# Patient Record
Sex: Male | Born: 1960 | Race: White | Hispanic: No | Marital: Single | Smoking: Never smoker
Health system: Southern US, Community
[De-identification: ages and names within clinical notes are randomized; demographics above are authoritative.]

## PROBLEM LIST (undated history)

## (undated) DIAGNOSIS — N189 Chronic kidney disease, unspecified: Secondary | ICD-10-CM

## (undated) DIAGNOSIS — J349 Unspecified disorder of nose and nasal sinuses: Secondary | ICD-10-CM

## (undated) DIAGNOSIS — H509 Unspecified strabismus: Secondary | ICD-10-CM

## (undated) DIAGNOSIS — Z889 Allergy status to unspecified drugs, medicaments and biological substances status: Secondary | ICD-10-CM

## (undated) DIAGNOSIS — Q211 Atrial septal defect, unspecified: Secondary | ICD-10-CM

## (undated) DIAGNOSIS — Z8774 Personal history of (corrected) congenital malformations of heart and circulatory system: Secondary | ICD-10-CM

## (undated) DIAGNOSIS — Z9889 Other specified postprocedural states: Secondary | ICD-10-CM

## (undated) DIAGNOSIS — K5790 Diverticulosis of intestine, part unspecified, without perforation or abscess without bleeding: Secondary | ICD-10-CM

## (undated) DIAGNOSIS — R0600 Dyspnea, unspecified: Secondary | ICD-10-CM

## (undated) DIAGNOSIS — R011 Cardiac murmur, unspecified: Secondary | ICD-10-CM

## (undated) DIAGNOSIS — I340 Nonrheumatic mitral (valve) insufficiency: Secondary | ICD-10-CM

## (undated) DIAGNOSIS — I059 Rheumatic mitral valve disease, unspecified: Secondary | ICD-10-CM

## (undated) DIAGNOSIS — I5032 Chronic diastolic (congestive) heart failure: Secondary | ICD-10-CM

## (undated) HISTORY — DX: Atrial septal defect, unspecified: Q21.10

## (undated) HISTORY — DX: Dyspnea, unspecified: R06.00

## (undated) HISTORY — PX: NASAL SINUS SURGERY: SHX719

## (undated) HISTORY — PX: EYE SURGERY: SHX253

## (undated) HISTORY — DX: Allergy status to unspecified drugs, medicaments and biological substances: Z88.9

## (undated) HISTORY — DX: Cardiac murmur, unspecified: R01.1

## (undated) HISTORY — DX: Nonrheumatic mitral (valve) insufficiency: I34.0

## (undated) HISTORY — DX: Atrial septal defect: Q21.1

## (undated) HISTORY — DX: Unspecified strabismus: H50.9

## (undated) HISTORY — DX: Unspecified disorder of nose and nasal sinuses: J34.9

## (undated) HISTORY — DX: Rheumatic mitral valve disease, unspecified: I05.9

## (undated) HISTORY — DX: Diverticulosis of intestine, part unspecified, without perforation or abscess without bleeding: K57.90

## (undated) HISTORY — PX: COLONOSCOPY: SHX174

## (undated) HISTORY — DX: Chronic kidney disease, unspecified: N18.9

---

## 2016-04-18 ENCOUNTER — Institutional Professional Consult (permissible substitution) (INDEPENDENT_AMBULATORY_CARE_PROVIDER_SITE_OTHER): Payer: Commercial Managed Care - PPO | Admitting: Thoracic Surgery (Cardiothoracic Vascular Surgery)

## 2016-04-18 ENCOUNTER — Encounter: Payer: Self-pay | Admitting: Thoracic Surgery (Cardiothoracic Vascular Surgery)

## 2016-04-18 DIAGNOSIS — I340 Nonrheumatic mitral (valve) insufficiency: Secondary | ICD-10-CM

## 2016-04-18 DIAGNOSIS — Q211 Atrial septal defect, unspecified: Secondary | ICD-10-CM

## 2016-04-18 HISTORY — DX: Nonrheumatic mitral (valve) insufficiency: I34.0

## 2016-04-18 NOTE — Patient Instructions (Signed)
Continue all previous medications without any changes at this time  Call Darius Bumpyan Brooks in our office to schedule surgery as desired

## 2016-04-18 NOTE — Progress Notes (Signed)
[address]       [place] [address]             [phone]     CARDIOTHORACIC SURGERY CONSULTATION REPORT  Referring Provider is Mayford Knife, Bernestine Amass., MD  Primary Cardiologist is McRae III, A. Maisie Fus, MD PCP is Lucretia Field, MD  Chief Complaint  Patient presents with  . Mitral Regurgitation    Surgical eval, 2ND opionion, Cardiac Cath 04/06/16, TEE 04/06/16 @ [facility] in [place]      HPI:  Patient is a 56 year old male with known history of mitral valve prolapse who has been referred for second opinion for possible surgical treatment of severe symptomatic primary mitral regurgitation. Patient states that he has known about the presence of a heart murmur for nearly 20 years. Over the past year he has noted the development of gradual progression of symptoms of exertional shortness of breath and occasional chest tightness. He was seen in follow-up by his primary care physician and a transthoracic echocardiogram performed demonstrating the presence of mitral valve prolapse with what appear to be a flail segment of posterior leaflet and severe mitral regurgitation.  Left ventricular systolic function remained normal with ejection fraction estimated 65%. There was flow reversal in the pulmonary veins, severe left atrial enlargement, and mildly enlarged left ventricle. The patient subsequently underwent transesophageal echocardiogram and diagnostic cardiac catheterization. TEE confirmed the presence of myxomatous degenerative disease of the mitral valve with a large flail segment involving the middle scallop of the posterior leaflet with multiple ruptured chordae tendineae. There was severe mitral regurgitation. There was flow reversal in the pulmonary veins. The regurgitant volume was calculated 197 mL. There was severe left atrial enlargement. There was a small atrial septal defect versus stretched patent foramen ovale. Left ventricular  systolic function appeared normal with ejection fraction calculated 55-65%. No other significant abnormalities were noted. Diagnostic cardiac catheterization was notable for the presence of normal coronary arteries with no significant coronary artery disease. Right heart catheterization was not performed. The patient was referred for surgical consultation and has seen a Careers adviser in [place]. The patient has requested a second opinion with interested in exploring the possibility of minimally invasive approach for surgery.  The patient is single and lives alone in [place]. He is a Technical sales engineer. He does not exercise on a regular basis but he reports no significant physical limitations other than problems with exertional shortness of breath and fatigue. The patient states that he has had a long history of some degree of exertional shortness of breath. Symptoms have gradually progressed over the past year, particularly over the last 4-6 weeks. The patient now gets short of breath just going up a single flight of steps. He denies any history of resting shortness of breath, PND, orthopnea, or lower extremity edema. He has had some transient dizzy spells without syncope. He reports some mild tightness across his chest that seems to be sporadic and not necessarily associated with physical activity or shortness of breath. He recently has had productive cough for the last several weeks. He thinks he may have had a flulike syndrome several weeks ago. All of his symptoms have resolved with exception of a mild residual cough.  Past Medical History:  Diagnosis Date  . Diverticulosis   . Dyspnea   . H/O seasonal allergies   . Heart murmur   . Mitral valve disease   . Severe mitral regurgitation 04/18/2016  . Sinus disease   .  Strabismus     Past Surgical History:  Procedure Laterality Date  . COLONOSCOPY    . EYE SURGERY     muscle  . NASAL SINUS SURGERY      Family History  Problem Relation  Age of Onset  . Hypertension Mother   . Heart disease Paternal Grandfather     Social History   Social History  . Marital status: Single    Spouse name: N/A  . Number of children: N/A  . Years of education: N/A   Occupational History  . musician    Social History Main Topics  . Smoking status: Never Smoker  . Smokeless tobacco: Never Used  . Alcohol use No  . Drug use: Unknown  . Sexual activity: Not on file   Other Topics Concern  . Not on file   Social History Narrative  . No narrative on file    Current Outpatient Prescriptions  Medication Sig Dispense Refill  . fluticasone (FLONASE) 50 MCG/ACT nasal spray Place into both nostrils daily.    Marland Kitchen loratadine (CLARITIN) 10 MG tablet Take 10 mg by mouth daily.     No current facility-administered medications for this visit.     No Known Allergies    Review of Systems:   General:  normal appetite, decreased energy, no weight gain, no weight loss, no fever  Cardiac:  occasional chest pain with exertion, occasional chest pain at rest, + SOB with exertion, no resting SOB, no PND, no orthopnea, no palpitations, no arrhythmia, no atrial fibrillation, no LE edema, + dizzy spells, no syncope  Respiratory:  + shortness of breath, no home oxygen, + productive cough, no dry cough, no bronchitis, no wheezing, no hemoptysis, no asthma, no pain with inspiration or cough, no sleep apnea, no CPAP at night  GI:   no difficulty swallowing, no reflux, no frequent heartburn, no hiatal hernia, no abdominal pain, no constipation, no diarrhea, no hematochezia, no hematemesis, no melena  GU:   no dysuria,  no frequency, no urinary tract infection, no hematuria, no enlarged prostate, no kidney stones, no kidney disease  Vascular:  no pain suggestive of claudication, no pain in feet, no leg cramps, no varicose veins, no DVT, no non-healing foot ulcer  Neuro:   no stroke, no TIA's, no seizures, no headaches, no temporary blindness one eye,  no  slurred speech, no peripheral neuropathy, no chronic pain, no instability of gait, no memory/cognitive dysfunction  Musculoskeletal: no arthritis, no joint swelling, no myalgias, no difficulty walking, no mobility   Skin:   no rash, no itching, no skin infections, no pressure sores or ulcerations  Psych:   no anxiety, no depression, no nervousness, no unusual recent stress  Eyes:   no blurry vision, no floaters, no recent vision changes, + wears glasses or contacts  ENT:   no hearing loss, no loose or painful teeth, no dentures, last saw dentist Sept 2017  Hematologic:  no easy bruising, no abnormal bleeding, no clotting disorder, no frequent epistaxis  Endocrine:  no diabetes, does not check CBG's at home     Physical Exam:   BP 127/78 (BP Location: Left Arm, Patient Position: Sitting, Cuff Size: Normal)   Pulse 86   Resp 16   Ht 6\' 3"  (1.905 m)   Wt 200 lb (90.7 kg)   SpO2 98% Comment: RA  BMI 25.00 kg/m   General:    well-appearing  HEENT:  Unremarkable   Neck:   no JVD, no bruits, no  adenopathy   Chest:   clear to auscultation, symmetrical breath sounds, no wheezes, no rhonchi   CV:   RRR, grade IV/VI holosystolic murmur   Abdomen:  soft, non-tender, no masses   Extremities:  warm, well-perfused, pulses palpable, no LE edema  Rectal/GU  Deferred  Neuro:   Grossly non-focal and symmetrical throughout  Skin:   Clean and dry, no rashes, no breakdown   Diagnostic Tests:  TRANSESOPHAGEAL ECHOCARDIOGRAM  Both images and the report from transesophageal echocardiogram performed 04/06/2016 by Dr. Theresa MulliganMcRae at [facility] in [place] are reviewed. The patient has obvious myxomatous degenerative disease of the mitral valve with a large flail scallop involving the P2 segment of the posterior leaflet. There are multiple ruptured primary chordae tendineae with severe mitral regurgitation. The jet of regurgitation is directed anteriorly. There was flow reversal  in the pulmonary veins. The regurgitant volume was calculated 197 mL. There was severe left atrial enlargement. There was left to right flow across the fossa ovalis consistent with a large patent foramen ovale or small secundum type atrial septal defect. Left ventricular size and systolic function appeared normal. Right ventricular size and function appeared normal. There was mild tricuspid regurgitation. The tricuspid annulus was not dilated.   CARDIAC CATHETERIZATION  Also images and report from diagnostic cardiac catheterization performed 04/06/2016 by Dr. Keenan BachelorBrian Jefferson at [facility] in [place] are reviewed. The patient has normal coronary artery anatomy with no significant coronary artery disease. There is right dominant coronary circulation.   CT CHEST/ABD/PELVIS without contrast  CT scan of the chest, abdomen, and pelvis performed without intravenous contrast is reviewed. There was mild diffuse opacity in the lungs consistent with mild pulmonary edema. No other significant abnormalities are noted. There is no calcification of appreciated in the wall of the thoracic or abdominal aorta or iliac vessels.    Impression:  Patient has stage D severe symptomatic primary mitral regurgitation caused by mitral valve prolapse with a large flail segment involving the middle scallop of the posterior leaflet of the mitral valve. He presents with progressive symptoms of exertional shortness of breath and fatigue consistent with chronic diastolic congestive heart failure, New York Heart Association functional class II. The patient also experiences occasional episodes of dizziness without syncope and chest tightness both with and without activity.  I have personally reviewed the patient's recent transesophageal echocardiogram and diagnostic cardiac catheterization. 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 The patient also has what appears to be a relatively large patent foramen ovale or small secundum type atrial septal defect.  I agree the patient needs elective mitral valve repair and closure of his atrial septal defect. Risks associated with elective surgery should be extremely low, and based upon review of the patient's transesophageal echocardiogram there is a very high likelihood that his valve should be repairable with an excellent and durable long-term result. He appears to be relatively good candidate for minimally invasive approach for surgery.     Plan:  The patient and his mother were counseled at length regarding the indications, risks and potential benefits of mitral valve repair.  The rationale for elective surgery has been explained, including a comparison between surgery and continued medical therapy with close follow-up.  The likelihood of successful and durable valve repair has been discussed with particular reference to the findings of their recent echocardiogram.  Based upon these  findings and previous experience, I have quoted them a greater than 95 percent likelihood of successful valve repair.   Alternative surgical approaches have been discussed including a comparison between conventional sternotomy and minimally-invasive techniques.  The relative risks and benefits of each have been reviewed as they pertain to the patient's specific circumstances, and all of their questions have been addressed.  Expectations for his postoperative convalescence at been discussed. All of his questions been addressed. The patient is interested in having surgery here in [place] to be close to his sister and mother who lives locally. He will discussed matters further with his family and call us back in the near future to schedule surgery.  I spent in excess of 90 minutes during the conduct of this office consultation and >50% of this time  involved direct face-to-face encounter with the patient for counseling and/or coordination of their care.  Salvatore Decent. Cornelius Moras, MD 04/18/2016 5:12 PM

## 2016-04-19 ENCOUNTER — Encounter: Payer: Self-pay | Admitting: *Deleted

## 2016-05-03 ENCOUNTER — Other Ambulatory Visit: Payer: Self-pay | Admitting: *Deleted

## 2016-05-03 ENCOUNTER — Other Ambulatory Visit: Payer: Self-pay | Admitting: Thoracic Surgery (Cardiothoracic Vascular Surgery)

## 2016-05-03 DIAGNOSIS — Q211 Atrial septal defect, unspecified: Secondary | ICD-10-CM

## 2016-05-03 DIAGNOSIS — I340 Nonrheumatic mitral (valve) insufficiency: Secondary | ICD-10-CM

## 2016-05-03 MED ORDER — AMIODARONE HCL 200 MG PO TABS: 200.0000 mg | ORAL_TABLET | Freq: Every day | ORAL | 0 refills | Status: DC

## 2016-05-03 NOTE — Progress Notes (Signed)
Prescription for amiodarone sent in to begin 7 days prior to surgery to decrease risks of atrial fibrillation.  Patient will be instructed not to take Claritin while he is taking amiodarone.  Purcell Nailslarence H Owen, MD 05/03/2016 4:59 PM

## 2016-05-14 ENCOUNTER — Ambulatory Visit (HOSPITAL_COMMUNITY)
Admission: RE | Admit: 2016-05-14 | Discharge: 2016-05-14 | Disposition: A | Discharge status: Home / Self Care | Payer: Commercial Managed Care - PPO | Source: Ambulatory Visit | Attending: Thoracic Surgery (Cardiothoracic Vascular Surgery) | Admitting: Thoracic Surgery (Cardiothoracic Vascular Surgery)

## 2016-05-14 ENCOUNTER — Other Ambulatory Visit: Payer: Self-pay | Admitting: *Deleted

## 2016-05-14 ENCOUNTER — Other Ambulatory Visit: Payer: Self-pay

## 2016-05-14 ENCOUNTER — Ambulatory Visit (HOSPITAL_BASED_OUTPATIENT_CLINIC_OR_DEPARTMENT_OTHER)
Admission: RE | Admit: 2016-05-14 | Discharge: 2016-05-14 | Disposition: A | Discharge status: Home / Self Care | Payer: Commercial Managed Care - PPO | Source: Ambulatory Visit | Attending: Thoracic Surgery (Cardiothoracic Vascular Surgery) | Admitting: Thoracic Surgery (Cardiothoracic Vascular Surgery)

## 2016-05-14 ENCOUNTER — Encounter (HOSPITAL_COMMUNITY): Payer: Self-pay

## 2016-05-14 ENCOUNTER — Ambulatory Visit (INDEPENDENT_AMBULATORY_CARE_PROVIDER_SITE_OTHER): Payer: Commercial Managed Care - PPO | Admitting: Thoracic Surgery (Cardiothoracic Vascular Surgery)

## 2016-05-14 ENCOUNTER — Encounter: Payer: Self-pay | Admitting: Thoracic Surgery (Cardiothoracic Vascular Surgery)

## 2016-05-14 VITALS — BP 144/76 | HR 91 | Resp 20 | Ht 75.0 in | Wt 199.0 lb

## 2016-05-14 DIAGNOSIS — N189 Chronic kidney disease, unspecified: Secondary | ICD-10-CM

## 2016-05-14 DIAGNOSIS — Q211 Atrial septal defect, unspecified: Secondary | ICD-10-CM

## 2016-05-14 DIAGNOSIS — I340 Nonrheumatic mitral (valve) insufficiency: Secondary | ICD-10-CM

## 2016-05-14 DIAGNOSIS — I059 Rheumatic mitral valve disease, unspecified: Secondary | ICD-10-CM

## 2016-05-14 DIAGNOSIS — Z01818 Encounter for other preprocedural examination: Secondary | ICD-10-CM

## 2016-05-14 DIAGNOSIS — N182 Chronic kidney disease, stage 2 (mild): Secondary | ICD-10-CM

## 2016-05-14 HISTORY — DX: Chronic kidney disease, unspecified: N18.9

## 2016-05-14 LAB — PULMONARY FUNCTION TEST
DL/VA % PRED: 66 %
DL/VA: 3.24 ml/min/mmHg/L
DLCO UNC % PRED: 54 %
DLCO cor % pred: 54 %
DLCO cor: 21.38 ml/min/mmHg
DLCO unc: 21.38 ml/min/mmHg
FEF 25-75 PRE: 3.59 L/s
FEF 25-75 Post: 4.63 L/sec
FEF2575-%CHANGE-POST: 28 %
FEF2575-%PRED-POST: 127 %
FEF2575-%Pred-Pre: 98 %
FEV1-%Change-Post: 5 %
FEV1-%Pred-Post: 89 %
FEV1-%Pred-Pre: 84 %
FEV1-PRE: 3.7 L
FEV1-Post: 3.91 L
FEV1FVC-%CHANGE-POST: 4 %
FEV1FVC-%PRED-PRE: 106 %
FEV6-%Change-Post: 1 %
FEV6-%Pred-Post: 83 %
FEV6-%Pred-Pre: 82 %
FEV6-Post: 4.64 L
FEV6-Pre: 4.59 L
FEV6FVC-%Pred-Post: 104 %
FEV6FVC-%Pred-Pre: 104 %
FVC-%Change-Post: 1 %
FVC-%PRED-PRE: 79 %
FVC-%Pred-Post: 80 %
FVC-PRE: 4.59 L
FVC-Post: 4.64 L
POST FEV1/FVC RATIO: 84 %
PRE FEV6/FVC RATIO: 100 %
Post FEV6/FVC ratio: 100 %
Pre FEV1/FVC ratio: 81 %
RV % pred: 96 %
RV: 2.36 L
TLC % pred: 88 %
TLC: 7.07 L

## 2016-05-14 LAB — COMPREHENSIVE METABOLIC PANEL
ALT: 21 U/L (ref 17–63)
AST: 31 U/L (ref 15–41)
Albumin: 4 g/dL (ref 3.5–5.0)
Alkaline Phosphatase: 46 U/L (ref 38–126)
Anion gap: 7 (ref 5–15)
BILIRUBIN TOTAL: 0.9 mg/dL (ref 0.3–1.2)
BUN: 13 mg/dL (ref 6–20)
CO2: 26 mmol/L (ref 22–32)
CREATININE: 1.52 mg/dL — AB (ref 0.61–1.24)
Calcium: 9.7 mg/dL (ref 8.9–10.3)
Chloride: 107 mmol/L (ref 101–111)
GFR, EST AFRICAN AMERICAN: 57 mL/min — AB (ref >60)
GFR, EST NON AFRICAN AMERICAN: 50 mL/min — AB (ref >60)
Glucose, Bld: 95 mg/dL (ref 65–99)
POTASSIUM: 4 mmol/L (ref 3.5–5.1)
Sodium: 140 mmol/L (ref 135–145)
TOTAL PROTEIN: 7.2 g/dL (ref 6.5–8.1)

## 2016-05-14 LAB — PROTIME-INR
INR: 1.07
PROTHROMBIN TIME: 13.9 s (ref 11.4–15.2)

## 2016-05-14 LAB — BLOOD GAS, ARTERIAL
Acid-base deficit: 0.2 mmol/L (ref 0.0–2.0)
BICARBONATE: 23.4 mmol/L (ref 20.0–28.0)
Drawn by: 470591
FIO2: 0.21
O2 SAT: 97 %
PCO2 ART: 34.7 mmHg (ref 32.0–48.0)
PO2 ART: 84.7 mmHg (ref 83.0–108.0)
Patient temperature: 98.6
pH, Arterial: 7.444 (ref 7.350–7.450)

## 2016-05-14 LAB — ABO/RH: ABO/RH(D): A POS

## 2016-05-14 LAB — URINALYSIS, ROUTINE W REFLEX MICROSCOPIC
Bilirubin Urine: NEGATIVE
GLUCOSE, UA: NEGATIVE mg/dL
Hgb urine dipstick: NEGATIVE
Ketones, ur: NEGATIVE mg/dL
LEUKOCYTES UA: NEGATIVE
NITRITE: NEGATIVE
PH: 7 (ref 5.0–8.0)
Protein, ur: NEGATIVE mg/dL
SPECIFIC GRAVITY, URINE: 1.018 (ref 1.005–1.030)

## 2016-05-14 LAB — SURGICAL PCR SCREEN
MRSA, PCR: NEGATIVE
STAPHYLOCOCCUS AUREUS: POSITIVE — AB

## 2016-05-14 LAB — TYPE AND SCREEN
ABO/RH(D): A POS
Antibody Screen: NEGATIVE

## 2016-05-14 LAB — APTT: APTT: 32 s (ref 24–36)

## 2016-05-14 MED ORDER — ALBUTEROL SULFATE (2.5 MG/3ML) 0.083% IN NEBU
2.5000 mg | INHALATION_SOLUTION | Freq: Once | RESPIRATORY_TRACT | Status: AC
  Administered 2016-05-14: 2.5 mg via RESPIRATORY_TRACT

## 2016-05-14 MED ORDER — CHLORHEXIDINE GLUCONATE 4 % EX LIQD
30.0000 mL | CUTANEOUS | Status: DC
Start: 2016-05-14 — End: 2016-05-15

## 2016-05-14 NOTE — Progress Notes (Addendum)
PCP: Dr. Lucretia FieldKenneth Melton ([place])  Cardiologist: Dr. Landry Mellowhomas McRae (in [place]) [phone]  EKG: 03/2016 in [facility]  Stress test: pt denies  ECHO: 03/2016 in [facility]  Cardiac Cath:03/2016 in [facility]  Chest x-ray:pt denies past 2 weeks  Dr. Cornelius Moraswen has records of all of the above per pt and family

## 2016-05-14 NOTE — Progress Notes (Signed)
[address]       [place] [id number]             [phone]     CARDIOTHORACIC SURGERY OFFICE NOTE  Referring Provider is Turner, Bernestine AmassSamuel J, MD Primary Cardiologist is McRae III, A. Maisie Fushomas, MD PCP is Lucretia FieldVargas, Kenneth, MD  HPI:  Patient returns to the office today for follow-up of mitral valve prolapse with stage D severe symptomatic primary mitral regurgitation. He was originally seen in consultation on 04/18/2016.  Since then he has made plans for his postoperative convalescence.  He states that he has remained stable and he returns to our office with plans to proceed with surgery later this week. He reports no new problems or complaints since his last office visit.   Current Outpatient Prescriptions  Medication Sig Dispense Refill  . amiodarone (PACERONE) 200 MG tablet Take 1 tablet (200 mg total) by mouth daily. 30 tablet 0  . fluticasone (FLONASE) 50 MCG/ACT nasal spray Place 1-2 sprays into both nostrils daily as needed for allergies.     Marland Kitchen. ibuprofen (ADVIL,MOTRIN) 200 MG tablet Take 400 mg by mouth every 8 (eight) hours as needed (for pain.).    Marland Kitchen. Multiple Vitamin (MULTIVITAMIN WITH MINERALS) TABS tablet Take 1 tablet by mouth daily.    . Omega-3 Fatty Acids (FISH OIL PO) Take 2 capsules by mouth daily.    Marland Kitchen. loratadine (CLARITIN) 10 MG tablet Take 10 mg by mouth daily.     No current facility-administered medications for this visit.    Facility-Administered Medications Ordered in Other Visits  Medication Dose Route Frequency Provider Last Rate Last Dose  . chlorhexidine (HIBICLENS) 4 % liquid 2 application  30 mL Topical UD Purcell Nailslarence H Jones Viviani, MD          Physical Exam:   BP (!) 144/76   Pulse 91   Resp 20   Ht 6\' 3"  (1.905 m)   Wt 199 lb (90.3 kg)   SpO2 98% Comment: RA  BMI 24.87 kg/m   General:  Well-appearing  Chest:   Clear to auscultation  CV:   Regular rate and rhythm with prominent holosystolic murmur  Incisions:  n/a  Abdomen:  Soft  and nontender  Extremities:  Warm and well-perfused  Diagnostic Tests:  CHEST  2 VIEW  COMPARISON:  None.  FINDINGS: The heart size and mediastinal contours are within normal limits. Both lungs are clear. The visualized skeletal structures are unremarkable.  IMPRESSION: No active cardiopulmonary disease.   Electronically Signed   By: Alcide CleverMark  Lukens M.D.   On: 05/14/2016 14:46   Impression:  Patient has stage D severe symptomatic primary mitral regurgitation caused by mitral valve prolapse with a large flail segment involving the middle scallop of the posterior leaflet. He presents with progressive symptoms of exertional shortness of breath and fatigue consistent with chronic diastolic congestive heart failure, York Heart Association function class II. The patient also experiences occasional episodes of dizziness without syncope and transient atypical symptoms of chest discomfort that occur both with and without physical activity.  I have personally reviewed the patient's recent transesophageal echocardiogram and diagnostic cardiac catheterization. 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 The patient also has what appears to be a relatively large patent foramen ovale or small secundum type atrial septal defect.  I agree the patient needs elective mitral valve repair and closure  of his atrial septal defect. Risks associated with elective surgery should be extremely low, and based upon review of the patient's transesophageal echocardiogram there is a very high likelihood that his valve should be repairable with an excellent and durable long-term result. He appears to be relatively good candidate for minimally invasive approach for surgery.  Routine preoperative blood work performed earlier today revealed abnormal serum creatinine measured 1.5.   The patient does not recall  ever having been told of any abnormal blood work in the past, including blood work that may have been performed prior to his recent diagnostic cardiac catheterization.   Plan:  The patient was again counseled at length regarding the indications, risks and potential benefits of mitral valve repair.  The rationale for elective surgery has been explained, including a comparison between surgery and continued medical therapy with close follow-up.  The likelihood of successful and durable valve repair has been discussed with particular reference to the findings of their recent echocardiogram.  Based upon these findings and previous experience, I have quoted them a greater than 95 percent likelihood of successful valve repair.  In the unlikely event that their valve cannot be successfully repaired, we discussed the possibility of replacing the mitral valve using a mechanical prosthesis with the attendant need for long-term anticoagulation versus the alternative of replacing it using a bioprosthetic tissue valve with its potential for late structural valve deterioration and failure, depending upon the patient's longevity.  The patient specifically requests that if the mitral valve must be replaced that it be done using a mechanical valve.   The patient understands and accepts all potential risks of surgery including but not limited to risk of death, stroke or other neurologic complication, myocardial infarction, congestive heart failure, respiratory failure, renal failure, bleeding requiring transfusion and/or reexploration, arrhythmia, infection or other wound complications, pneumonia, pleural and/or pericardial effusion, pulmonary embolus, aortic dissection or other major vascular complication, or delayed complications related to valve repair or replacement including but not limited to structural valve deterioration and failure, thrombosis, embolization, endocarditis, or paravalvular leak.  Alternative surgical  approaches have been discussed including a comparison between conventional sternotomy and minimally-invasive techniques.  The relative risks and benefits of each have been reviewed as they pertain to the patient's specific circumstances, and all of their questions have been addressed.  Specific risks potentially related to the minimally-invasive approach were discussed at length, including but not limited to risk of conversion to full or partial sternotomy, aortic dissection or other major vascular complication, unilateral acute lung injury or pulmonary edema, phrenic nerve dysfunction or paralysis, rib fracture, chronic pain, lung hernia, or lymphocele.   Expectations for his convalescence at been discussed. All of their questions have been answered.  The patient will contact his cardiologist and primary care physician's office in [place] to inquire as to whether not he has had basic serum chemistries tested in the past and if so to inquire specifically what the serum creatinine was at that time. We will plan to repeat serum creatinine on arrival prior to surgery but we will not plan to postpone surgery unless it has increased significantly.    I spent in excess of 30 minutes during the conduct of this office consultation and >50% of this time involved direct face-to-face encounter with the patient for counseling and/or coordination of their care.    Salvatore Decent. Cornelius Moras, MD 05/14/2016 2:31 PM

## 2016-05-14 NOTE — Pre-Procedure Instructions (Addendum)
    Trevor JewelJohn E Melton  05/14/2016      [facility] [id number] - [place], [place] - [address] [place] [id number] Phone: [phone] Fax: [phone]    Your procedure is scheduled on Wed., March 7 @ 830 AM.  [facility]mitting at 630 A.M.  Call this number if you have problems the morning of surgery:  [phone]   Remember:  Do not eat food or drink liquids after midnight.  Take these medicines the morning of surgery with A SIP OF WATER amiodarone (pacerone), fluticasone (flonase) nasal spray.  Stop taking any herbal medication, vitamins, ibuprofen, Aleve, Advil, Goody's, BC's now.   Do not wear jewelry.  Do not wear lotions, powders, or colognes, or deoderant.             Men may shave face and neck.  Do not bring valuables to the hospital.  [facility] is not responsible for any belongings or valuables.  Contacts, dentures or bridgework may not be worn into surgery.  Leave your suitcase in the car.  After surgery it may be brought to your room.  For patients admitted to the hospital, discharge time will be determined by your treatment team.  Patients discharged the day of surgery will not be allowed to drive home.    Special instructions: see attached  Please read over the following fact sheets that you were given. Pain Booklet, Coughing and Deep Breathing, MRSA Information and Surgical Site Infection Prevention

## 2016-05-14 NOTE — Patient Instructions (Signed)
   Continue taking all current medications without change through the day before surgery.  Have nothing to eat or drink after midnight the night before surgery.  On the morning of surgery do not take any medications   

## 2016-05-14 NOTE — Progress Notes (Signed)
Pre-op Cardiac Surgery  Carotid Findings:  Study completed at  another facility. No evidence of significant ICA stenosis. Vertebral artery flow is antegrade.  Upper Extremity Right Left  Brachial Pressures 126 Triphasic 127 Triphasic  Radial Waveforms Triphasic Triphasic  Ulnar Waveforms Triphasic Triphasic  Palmar Arch (Allen's Test) Normal Normal   Findings:  Doppler waveforms remained normal bilaterally with both radial and ulnar compressions.  Graybar ElectricVirginia Damaree Sargent, RVS  05/14/2016 11:20 AM

## 2016-05-15 LAB — HEMOGLOBIN A1C
Hgb A1c MFr Bld: 4.8 % (ref 4.8–5.6)
Mean Plasma Glucose: 91 mg/dL

## 2016-05-15 MED ORDER — POTASSIUM CHLORIDE 2 MEQ/ML IV SOLN
80.0000 meq | INTRAVENOUS | Status: DC
  Filled 2016-05-15: qty 40

## 2016-05-15 MED ORDER — TRANEXAMIC ACID (OHS) PUMP PRIME SOLUTION
2.0000 mg/kg | INTRAVENOUS | Status: DC
  Filled 2016-05-15: qty 1.81

## 2016-05-15 MED ORDER — DEXTROSE 5 % IV SOLN
0.0000 ug/min | INTRAVENOUS | Status: DC
  Filled 2016-05-15: qty 4

## 2016-05-15 MED ORDER — TRANEXAMIC ACID (OHS) BOLUS VIA INFUSION
15.0000 mg/kg | INTRAVENOUS | Status: AC
  Administered 2016-05-16: 1354.5 mg via INTRAVENOUS
  Filled 2016-05-15: qty 1355

## 2016-05-15 MED ORDER — METOPROLOL TARTRATE 12.5 MG HALF TABLET
12.5000 mg | ORAL_TABLET | Freq: Once | ORAL | Status: AC
  Administered 2016-05-16: 12.5 mg via ORAL
  Filled 2016-05-15: qty 1

## 2016-05-15 MED ORDER — DOPAMINE-DEXTROSE 3.2-5 MG/ML-% IV SOLN
0.0000 ug/kg/min | INTRAVENOUS | Status: AC
  Administered 2016-05-16: 3 ug/kg/min via INTRAVENOUS
  Filled 2016-05-15: qty 250

## 2016-05-15 MED ORDER — SODIUM CHLORIDE 0.9 % IV SOLN
INTRAVENOUS | Status: DC
  Filled 2016-05-15: qty 2.5

## 2016-05-15 MED ORDER — CHLORHEXIDINE GLUCONATE 0.12 % MT SOLN
15.0000 mL | Freq: Once | OROMUCOSAL | Status: AC
  Administered 2016-05-16: 15 mL via OROMUCOSAL
  Filled 2016-05-15: qty 15

## 2016-05-15 MED ORDER — NITROGLYCERIN IN D5W 200-5 MCG/ML-% IV SOLN
2.0000 ug/min | INTRAVENOUS | Status: DC
  Filled 2016-05-15: qty 250

## 2016-05-15 MED ORDER — SODIUM CHLORIDE 0.9 % IV SOLN
1500.0000 mg | INTRAVENOUS | Status: DC
  Filled 2016-05-15: qty 1500

## 2016-05-15 MED ORDER — TRANEXAMIC ACID 1000 MG/10ML IV SOLN
1.5000 mg/kg/h | INTRAVENOUS | Status: AC
  Administered 2016-05-16: 1.5 mg/kg/h via INTRAVENOUS
  Filled 2016-05-15: qty 25

## 2016-05-15 MED ORDER — GLUTARALDEHYDE 0.625% SOAKING SOLUTION
TOPICAL | Status: DC | PRN
  Filled 2016-05-15: qty 50

## 2016-05-15 MED ORDER — DEXTROSE 5 % IV SOLN
1.5000 g | INTRAVENOUS | Status: DC
  Filled 2016-05-15: qty 1.5

## 2016-05-15 MED ORDER — VANCOMYCIN HCL 1000 MG IV SOLR
INTRAVENOUS | Status: AC
  Administered 2016-05-16: 1000 mL
  Filled 2016-05-15 (×3): qty 1000

## 2016-05-15 MED ORDER — HEPARIN SODIUM (PORCINE) 1000 UNIT/ML IJ SOLN
INTRAMUSCULAR | Status: DC
  Filled 2016-05-15: qty 30

## 2016-05-15 MED ORDER — DEXTROSE 5 % IV SOLN
750.0000 mg | INTRAVENOUS | Status: DC
  Filled 2016-05-15: qty 750

## 2016-05-15 MED ORDER — MAGNESIUM SULFATE 50 % IJ SOLN
40.0000 meq | INTRAMUSCULAR | Status: DC
  Filled 2016-05-15: qty 10

## 2016-05-15 MED ORDER — PLASMA-LYTE 148 IV SOLN
INTRAVENOUS | Status: DC
  Filled 2016-05-15: qty 2.5

## 2016-05-15 MED ORDER — SODIUM CHLORIDE 0.9 % IV SOLN
30.0000 ug/min | INTRAVENOUS | Status: DC
  Filled 2016-05-15: qty 2

## 2016-05-15 MED ORDER — DEXMEDETOMIDINE HCL IN NACL 400 MCG/100ML IV SOLN
0.1000 ug/kg/h | INTRAVENOUS | Status: DC
  Filled 2016-05-15: qty 100

## 2016-05-16 ENCOUNTER — Inpatient Hospital Stay (HOSPITAL_COMMUNITY): Payer: Commercial Managed Care - PPO

## 2016-05-16 ENCOUNTER — Inpatient Hospital Stay (HOSPITAL_COMMUNITY): Payer: Commercial Managed Care - PPO | Admitting: Anesthesiology

## 2016-05-16 ENCOUNTER — Encounter (HOSPITAL_COMMUNITY): Payer: Self-pay | Admitting: Thoracic Surgery (Cardiothoracic Vascular Surgery)

## 2016-05-16 ENCOUNTER — Inpatient Hospital Stay (HOSPITAL_COMMUNITY)
Admission: RE | Admit: 2016-05-16 | Discharge: 2016-05-22 | DRG: 219 | Disposition: A | Discharge status: Home / Self Care | Payer: Commercial Managed Care - PPO | Source: Ambulatory Visit | Attending: Thoracic Surgery (Cardiothoracic Vascular Surgery) | Admitting: Thoracic Surgery (Cardiothoracic Vascular Surgery)

## 2016-05-16 ENCOUNTER — Encounter (HOSPITAL_COMMUNITY)
Admission: RE | Disposition: A | Discharge status: Home / Self Care | Payer: Self-pay | Source: Ambulatory Visit | Attending: Thoracic Surgery (Cardiothoracic Vascular Surgery)

## 2016-05-16 DIAGNOSIS — Q211 Atrial septal defect, unspecified: Secondary | ICD-10-CM

## 2016-05-16 DIAGNOSIS — H509 Unspecified strabismus: Secondary | ICD-10-CM | POA: Diagnosis present

## 2016-05-16 DIAGNOSIS — J9811 Atelectasis: Secondary | ICD-10-CM | POA: Diagnosis not present

## 2016-05-16 DIAGNOSIS — I340 Nonrheumatic mitral (valve) insufficiency: Principal | ICD-10-CM | POA: Diagnosis present

## 2016-05-16 DIAGNOSIS — I272 Pulmonary hypertension, unspecified: Secondary | ICD-10-CM | POA: Diagnosis not present

## 2016-05-16 DIAGNOSIS — R0602 Shortness of breath: Secondary | ICD-10-CM | POA: Diagnosis present

## 2016-05-16 DIAGNOSIS — I341 Nonrheumatic mitral (valve) prolapse: Secondary | ICD-10-CM | POA: Diagnosis present

## 2016-05-16 DIAGNOSIS — D6959 Other secondary thrombocytopenia: Secondary | ICD-10-CM | POA: Diagnosis not present

## 2016-05-16 DIAGNOSIS — J939 Pneumothorax, unspecified: Secondary | ICD-10-CM | POA: Diagnosis not present

## 2016-05-16 DIAGNOSIS — R410 Disorientation, unspecified: Secondary | ICD-10-CM | POA: Diagnosis not present

## 2016-05-16 DIAGNOSIS — Z9889 Other specified postprocedural states: Secondary | ICD-10-CM

## 2016-05-16 DIAGNOSIS — D62 Acute posthemorrhagic anemia: Secondary | ICD-10-CM | POA: Diagnosis not present

## 2016-05-16 DIAGNOSIS — I441 Atrioventricular block, second degree: Secondary | ICD-10-CM | POA: Diagnosis not present

## 2016-05-16 DIAGNOSIS — Z79899 Other long term (current) drug therapy: Secondary | ICD-10-CM | POA: Diagnosis not present

## 2016-05-16 DIAGNOSIS — I5033 Acute on chronic diastolic (congestive) heart failure: Secondary | ICD-10-CM | POA: Diagnosis not present

## 2016-05-16 DIAGNOSIS — Z8774 Personal history of (corrected) congenital malformations of heart and circulatory system: Secondary | ICD-10-CM

## 2016-05-16 DIAGNOSIS — N189 Chronic kidney disease, unspecified: Secondary | ICD-10-CM | POA: Diagnosis present

## 2016-05-16 DIAGNOSIS — I5032 Chronic diastolic (congestive) heart failure: Secondary | ICD-10-CM | POA: Diagnosis present

## 2016-05-16 DIAGNOSIS — E876 Hypokalemia: Secondary | ICD-10-CM | POA: Diagnosis not present

## 2016-05-16 DIAGNOSIS — J90 Pleural effusion, not elsewhere classified: Secondary | ICD-10-CM

## 2016-05-16 DIAGNOSIS — I511 Rupture of chordae tendineae, not elsewhere classified: Secondary | ICD-10-CM | POA: Diagnosis not present

## 2016-05-16 HISTORY — PX: MITRAL VALVE REPAIR: SHX2039

## 2016-05-16 HISTORY — PX: ASD REPAIR: SHX258

## 2016-05-16 HISTORY — PX: CLIPPING OF ATRIAL APPENDAGE: SHX5773

## 2016-05-16 HISTORY — DX: Other specified postprocedural states: Z98.890

## 2016-05-16 HISTORY — DX: Chronic diastolic (congestive) heart failure: I50.32

## 2016-05-16 HISTORY — PX: TEE WITHOUT CARDIOVERSION: SHX5443

## 2016-05-16 HISTORY — DX: Personal history of (corrected) congenital malformations of heart and circulatory system: Z87.74

## 2016-05-16 LAB — CBC
HCT: 38.2 % — ABNORMAL LOW (ref 39.0–52.0)
HEMATOCRIT: 36.9 % — AB (ref 39.0–52.0)
HEMOGLOBIN: 13.2 g/dL (ref 13.0–17.0)
Hemoglobin: 12.6 g/dL — ABNORMAL LOW (ref 13.0–17.0)
MCH: 30.1 pg (ref 26.0–34.0)
MCH: 30.8 pg (ref 26.0–34.0)
MCHC: 34.1 g/dL (ref 30.0–36.0)
MCHC: 34.6 g/dL (ref 30.0–36.0)
MCV: 88.1 fL (ref 78.0–100.0)
MCV: 89 fL (ref 78.0–100.0)
PLATELETS: 173 10*3/uL (ref 150–400)
PLATELETS: 132 10*3/uL — AB (ref 150–400)
RBC: 4.19 MIL/uL — ABNORMAL LOW (ref 4.22–5.81)
RBC: 4.29 MIL/uL (ref 4.22–5.81)
RDW: 13.4 % (ref 11.5–15.5)
RDW: 13.7 % (ref 11.5–15.5)
WBC: 5.4 10*3/uL (ref 4.0–10.5)
WBC: 18.5 10*3/uL — ABNORMAL HIGH (ref 4.0–10.5)

## 2016-05-16 LAB — POCT I-STAT, CHEM 8
BUN: 14 mg/dL (ref 6–20)
BUN: 13 mg/dL (ref 6–20)
BUN: 14 mg/dL (ref 6–20)
BUN: 12 mg/dL (ref 6–20)
BUN: 12 mg/dL (ref 6–20)
BUN: 12 mg/dL (ref 6–20)
BUN: 12 mg/dL (ref 6–20)
BUN: 11 mg/dL (ref 6–20)
CALCIUM ION: 1.3 mmol/L (ref 1.15–1.40)
CALCIUM ION: 1.19 mmol/L (ref 1.15–1.40)
CALCIUM ION: 1.21 mmol/L (ref 1.15–1.40)
CHLORIDE: 103 mmol/L (ref 101–111)
CHLORIDE: 107 mmol/L (ref 101–111)
CHLORIDE: 109 mmol/L (ref 101–111)
Calcium, Ion: 1.11 mmol/L — ABNORMAL LOW (ref 1.15–1.40)
Calcium, Ion: 1.13 mmol/L — ABNORMAL LOW (ref 1.15–1.40)
Calcium, Ion: 1.12 mmol/L — ABNORMAL LOW (ref 1.15–1.40)
Calcium, Ion: 1.13 mmol/L — ABNORMAL LOW (ref 1.15–1.40)
Calcium, Ion: 1.14 mmol/L — ABNORMAL LOW (ref 1.15–1.40)
Chloride: 106 mmol/L (ref 101–111)
Chloride: 106 mmol/L (ref 101–111)
Chloride: 101 mmol/L (ref 101–111)
Chloride: 106 mmol/L (ref 101–111)
Chloride: 108 mmol/L (ref 101–111)
Creatinine, Ser: 1.3 mg/dL — ABNORMAL HIGH (ref 0.61–1.24)
Creatinine, Ser: 1.1 mg/dL (ref 0.61–1.24)
Creatinine, Ser: 1.2 mg/dL (ref 0.61–1.24)
Creatinine, Ser: 1.1 mg/dL (ref 0.61–1.24)
Creatinine, Ser: 1.1 mg/dL (ref 0.61–1.24)
Creatinine, Ser: 1 mg/dL (ref 0.61–1.24)
Creatinine, Ser: 1.1 mg/dL (ref 0.61–1.24)
Creatinine, Ser: 1.1 mg/dL (ref 0.61–1.24)
GLUCOSE: 128 mg/dL — AB (ref 65–99)
Glucose, Bld: 96 mg/dL (ref 65–99)
Glucose, Bld: 111 mg/dL — ABNORMAL HIGH (ref 65–99)
Glucose, Bld: 121 mg/dL — ABNORMAL HIGH (ref 65–99)
Glucose, Bld: 134 mg/dL — ABNORMAL HIGH (ref 65–99)
Glucose, Bld: 137 mg/dL — ABNORMAL HIGH (ref 65–99)
Glucose, Bld: 130 mg/dL — ABNORMAL HIGH (ref 65–99)
Glucose, Bld: 122 mg/dL — ABNORMAL HIGH (ref 65–99)
HCT: 34 % — ABNORMAL LOW (ref 39.0–52.0)
HCT: 32 % — ABNORMAL LOW (ref 39.0–52.0)
HCT: 34 % — ABNORMAL LOW (ref 39.0–52.0)
HCT: 34 % — ABNORMAL LOW (ref 39.0–52.0)
HEMATOCRIT: 25 % — AB (ref 39.0–52.0)
HEMATOCRIT: 25 % — AB (ref 39.0–52.0)
HEMATOCRIT: 25 % — AB (ref 39.0–52.0)
HEMATOCRIT: 26 % — AB (ref 39.0–52.0)
HEMOGLOBIN: 11.6 g/dL — AB (ref 13.0–17.0)
HEMOGLOBIN: 10.9 g/dL — AB (ref 13.0–17.0)
HEMOGLOBIN: 8.5 g/dL — AB (ref 13.0–17.0)
HEMOGLOBIN: 8.5 g/dL — AB (ref 13.0–17.0)
HEMOGLOBIN: 8.5 g/dL — AB (ref 13.0–17.0)
HEMOGLOBIN: 11.6 g/dL — AB (ref 13.0–17.0)
Hemoglobin: 11.6 g/dL — ABNORMAL LOW (ref 13.0–17.0)
Hemoglobin: 8.8 g/dL — ABNORMAL LOW (ref 13.0–17.0)
POTASSIUM: 4.9 mmol/L (ref 3.5–5.1)
POTASSIUM: 5.2 mmol/L — AB (ref 3.5–5.1)
POTASSIUM: 4.2 mmol/L (ref 3.5–5.1)
Potassium: 3.9 mmol/L (ref 3.5–5.1)
Potassium: 4.6 mmol/L (ref 3.5–5.1)
Potassium: 4.6 mmol/L (ref 3.5–5.1)
Potassium: 5 mmol/L (ref 3.5–5.1)
Potassium: 4.6 mmol/L (ref 3.5–5.1)
SODIUM: 141 mmol/L (ref 135–145)
SODIUM: 141 mmol/L (ref 135–145)
SODIUM: 133 mmol/L — AB (ref 135–145)
SODIUM: 139 mmol/L (ref 135–145)
SODIUM: 139 mmol/L (ref 135–145)
SODIUM: 141 mmol/L (ref 135–145)
Sodium: 141 mmol/L (ref 135–145)
Sodium: 141 mmol/L (ref 135–145)
TCO2: 28 mmol/L (ref 0–100)
TCO2: 25 mmol/L (ref 0–100)
TCO2: 26 mmol/L (ref 0–100)
TCO2: 25 mmol/L (ref 0–100)
TCO2: 26 mmol/L (ref 0–100)
TCO2: 25 mmol/L (ref 0–100)
TCO2: 22 mmol/L (ref 0–100)
TCO2: 22 mmol/L (ref 0–100)

## 2016-05-16 LAB — POCT I-STAT 3, ART BLOOD GAS (G3+)
ACID-BASE DEFICIT: 6 mmol/L — AB (ref 0.0–2.0)
Acid-base deficit: 1 mmol/L (ref 0.0–2.0)
Acid-base deficit: 5 mmol/L — ABNORMAL HIGH (ref 0.0–2.0)
BICARBONATE: 19.9 mmol/L — AB (ref 20.0–28.0)
Bicarbonate: 24.9 mmol/L (ref 20.0–28.0)
Bicarbonate: 20.5 mmol/L (ref 20.0–28.0)
O2 SAT: 100 %
O2 SAT: 93 %
O2 Saturation: 95 %
PCO2 ART: 43.8 mmHg (ref 32.0–48.0)
PCO2 ART: 37.1 mmHg (ref 32.0–48.0)
PH ART: 7.363 (ref 7.350–7.450)
PH ART: 7.344 — AB (ref 7.350–7.450)
PH ART: 7.342 — AB (ref 7.350–7.450)
PO2 ART: 67 mmHg — AB (ref 83.0–108.0)
Patient temperature: 36
TCO2: 26 mmol/L (ref 0–100)
TCO2: 22 mmol/L (ref 0–100)
TCO2: 21 mmol/L (ref 0–100)
pCO2 arterial: 36.3 mmHg (ref 32.0–48.0)
pO2, Arterial: 308 mmHg — ABNORMAL HIGH (ref 83.0–108.0)
pO2, Arterial: 76 mmHg — ABNORMAL LOW (ref 83.0–108.0)

## 2016-05-16 LAB — BASIC METABOLIC PANEL
Anion gap: 7 (ref 5–15)
BUN: 14 mg/dL (ref 6–20)
CO2: 24 mmol/L (ref 22–32)
CREATININE: 1.45 mg/dL — AB (ref 0.61–1.24)
Calcium: 9.5 mg/dL (ref 8.9–10.3)
Chloride: 107 mmol/L (ref 101–111)
GFR calc Af Amer: >60 mL/min (ref >60)
GFR, EST NON AFRICAN AMERICAN: 52 mL/min — AB (ref >60)
Glucose, Bld: 94 mg/dL (ref 65–99)
POTASSIUM: 3.7 mmol/L (ref 3.5–5.1)
SODIUM: 138 mmol/L (ref 135–145)

## 2016-05-16 LAB — GLUCOSE, CAPILLARY
GLUCOSE-CAPILLARY: 125 mg/dL — AB (ref 65–99)
GLUCOSE-CAPILLARY: 126 mg/dL — AB (ref 65–99)
GLUCOSE-CAPILLARY: 132 mg/dL — AB (ref 65–99)
GLUCOSE-CAPILLARY: 144 mg/dL — AB (ref 65–99)
Glucose-Capillary: 127 mg/dL — ABNORMAL HIGH (ref 65–99)
Glucose-Capillary: 121 mg/dL — ABNORMAL HIGH (ref 65–99)
Glucose-Capillary: 128 mg/dL — ABNORMAL HIGH (ref 65–99)

## 2016-05-16 LAB — POCT I-STAT 4, (NA,K, GLUC, HGB,HCT)
Glucose, Bld: 146 mg/dL — ABNORMAL HIGH (ref 65–99)
HCT: 38 % — ABNORMAL LOW (ref 39.0–52.0)
HEMOGLOBIN: 12.9 g/dL — AB (ref 13.0–17.0)
POTASSIUM: 4.7 mmol/L (ref 3.5–5.1)
SODIUM: 141 mmol/L (ref 135–145)

## 2016-05-16 LAB — ECHO TEE
AOASC: 2.9 cm
Ao-desc: 2 cm
STJ: 3 cm
Sinus: 3.5 cm

## 2016-05-16 LAB — PROTIME-INR
INR: 1.42
PROTHROMBIN TIME: 17.5 s — AB (ref 11.4–15.2)

## 2016-05-16 LAB — CREATININE, SERUM: CREATININE: 1.16 mg/dL (ref 0.61–1.24)

## 2016-05-16 LAB — APTT: aPTT: 36 seconds (ref 24–36)

## 2016-05-16 LAB — PLATELET COUNT: PLATELETS: 105 10*3/uL — AB (ref 150–400)

## 2016-05-16 LAB — MAGNESIUM: MAGNESIUM: 3.1 mg/dL — AB (ref 1.7–2.4)

## 2016-05-16 LAB — HEMOGLOBIN AND HEMATOCRIT, BLOOD
HEMATOCRIT: 24.5 % — AB (ref 39.0–52.0)
Hemoglobin: 8.5 g/dL — ABNORMAL LOW (ref 13.0–17.0)

## 2016-05-16 SURGERY — REPAIR, MITRAL VALVE, MINIMALLY INVASIVE
Anesthesia: General | Site: Chest | Laterality: Right

## 2016-05-16 MED ORDER — BUPIVACAINE HCL (PF) 0.5 % IJ SOLN
INTRAMUSCULAR | Status: AC
  Filled 2016-05-16: qty 10

## 2016-05-16 MED ORDER — PROTAMINE SULFATE 10 MG/ML IV SOLN
INTRAVENOUS | Status: AC
  Filled 2016-05-16: qty 25

## 2016-05-16 MED ORDER — FENTANYL CITRATE (PF) 250 MCG/5ML IJ SOLN
INTRAMUSCULAR | Status: AC
  Filled 2016-05-16: qty 20

## 2016-05-16 MED ORDER — ALBUMIN HUMAN 5 % IV SOLN
250.0000 mL | INTRAVENOUS | Status: AC | PRN
  Administered 2016-05-16 – 2016-05-17 (×4): 250 mL via INTRAVENOUS
  Filled 2016-05-16: qty 250

## 2016-05-16 MED ORDER — PROTAMINE SULFATE 10 MG/ML IV SOLN
INTRAVENOUS | Status: AC
  Filled 2016-05-16: qty 5

## 2016-05-16 MED ORDER — MILRINONE LACTATE IN DEXTROSE 20-5 MG/100ML-% IV SOLN
0.1250 ug/kg/min | INTRAVENOUS | Status: DC
  Filled 2016-05-16 (×2): qty 100

## 2016-05-16 MED ORDER — LACTATED RINGERS IV SOLN
INTRAVENOUS | Status: DC | PRN
  Administered 2016-05-16: 10:00:00 via INTRAVENOUS

## 2016-05-16 MED ORDER — SODIUM CHLORIDE 0.9% FLUSH
3.0000 mL | Freq: Two times a day (BID) | INTRAVENOUS | Status: DC
  Administered 2016-05-17 – 2016-05-21 (×5): 3 mL via INTRAVENOUS

## 2016-05-16 MED ORDER — LACTATED RINGERS IV SOLN: 500.0000 mL | Freq: Once | INTRAVENOUS | Status: DC | PRN

## 2016-05-16 MED ORDER — HEPARIN SODIUM (PORCINE) 1000 UNIT/ML IJ SOLN
INTRAMUSCULAR | Status: AC
  Filled 2016-05-16: qty 1

## 2016-05-16 MED ORDER — ASPIRIN 81 MG PO CHEW: 324.0000 mg | CHEWABLE_TABLET | Freq: Every day | ORAL | Status: DC

## 2016-05-16 MED ORDER — MORPHINE SULFATE (PF) 2 MG/ML IV SOLN
1.0000 mg | INTRAVENOUS | Status: DC | PRN
  Administered 2016-05-16 – 2016-05-17 (×3): 2 mg via INTRAVENOUS
  Filled 2016-05-16 (×3): qty 1

## 2016-05-16 MED ORDER — BISACODYL 10 MG RE SUPP: 10.0000 mg | Freq: Every day | RECTAL | Status: DC

## 2016-05-16 MED ORDER — ACETAMINOPHEN 160 MG/5ML PO SOLN
1000.0000 mg | Freq: Four times a day (QID) | ORAL | Status: DC
  Administered 2016-05-16: 1000 mg
  Filled 2016-05-16: qty 40.6

## 2016-05-16 MED ORDER — LIDOCAINE HCL (CARDIAC) 20 MG/ML IV SOLN
INTRAVENOUS | Status: DC | PRN
  Administered 2016-05-16: 40 mg via INTRAVENOUS

## 2016-05-16 MED ORDER — PHENYLEPHRINE 40 MCG/ML (10ML) SYRINGE FOR IV PUSH (FOR BLOOD PRESSURE SUPPORT)
PREFILLED_SYRINGE | INTRAVENOUS | Status: AC
  Filled 2016-05-16: qty 10

## 2016-05-16 MED ORDER — DEXTROSE 5 % IV SOLN
1.5000 g | Freq: Two times a day (BID) | INTRAVENOUS | Status: AC
  Administered 2016-05-17 – 2016-05-18 (×4): 1.5 g via INTRAVENOUS
  Filled 2016-05-16 (×4): qty 1.5

## 2016-05-16 MED ORDER — ACETAMINOPHEN 500 MG PO TABS
1000.0000 mg | ORAL_TABLET | Freq: Four times a day (QID) | ORAL | Status: DC
  Administered 2016-05-17 – 2016-05-20 (×12): 1000 mg via ORAL
  Filled 2016-05-16 (×12): qty 2

## 2016-05-16 MED ORDER — METOPROLOL TARTRATE 5 MG/5ML IV SOLN: 2.5000 mg | INTRAVENOUS | Status: DC | PRN

## 2016-05-16 MED ORDER — INSULIN REGULAR BOLUS VIA INFUSION
0.0000 [IU] | Freq: Three times a day (TID) | INTRAVENOUS | Status: DC
  Filled 2016-05-16: qty 10

## 2016-05-16 MED ORDER — LACTATED RINGERS IV SOLN: INTRAVENOUS | Status: DC

## 2016-05-16 MED ORDER — LACTATED RINGERS IV SOLN
INTRAVENOUS | Status: DC
  Administered 2016-05-16: 17:00:00 via INTRAVENOUS

## 2016-05-16 MED ORDER — PHENYLEPHRINE HCL 10 MG/ML IJ SOLN
INTRAMUSCULAR | Status: DC | PRN
  Administered 2016-05-16: 80 ug via INTRAVENOUS

## 2016-05-16 MED ORDER — LIDOCAINE HCL (PF) 2 % IJ SOLN
13.0000 mL | Freq: Once | INTRAMUSCULAR | Status: DC
  Filled 2016-05-16: qty 15

## 2016-05-16 MED ORDER — LIDOCAINE 2% (20 MG/ML) 5 ML SYRINGE
INTRAMUSCULAR | Status: AC
  Filled 2016-05-16: qty 5

## 2016-05-16 MED ORDER — ARTIFICIAL TEARS OP OINT
TOPICAL_OINTMENT | OPHTHALMIC | Status: DC | PRN
  Administered 2016-05-16: 1 via OPHTHALMIC

## 2016-05-16 MED ORDER — VANCOMYCIN HCL 1000 MG IV SOLR
INTRAVENOUS | Status: DC | PRN
  Administered 2016-05-16: 1000 mg via INTRAVENOUS

## 2016-05-16 MED ORDER — MIDAZOLAM HCL 10 MG/2ML IJ SOLN
INTRAMUSCULAR | Status: AC
  Filled 2016-05-16: qty 2

## 2016-05-16 MED ORDER — SODIUM CHLORIDE 0.45 % IV SOLN
INTRAVENOUS | Status: DC | PRN
  Administered 2016-05-16: 17:00:00 via INTRAVENOUS

## 2016-05-16 MED ORDER — ORAL CARE MOUTH RINSE
15.0000 mL | OROMUCOSAL | Status: DC
  Administered 2016-05-16 – 2016-05-17 (×7): 15 mL via OROMUCOSAL

## 2016-05-16 MED ORDER — LACTATED RINGERS IV SOLN: INTRAVENOUS | Status: DC | PRN

## 2016-05-16 MED ORDER — ACETAMINOPHEN 160 MG/5ML PO SOLN: 650.0000 mg | Freq: Once | ORAL | Status: AC

## 2016-05-16 MED ORDER — VANCOMYCIN HCL 1000 MG IV SOLR
INTRAVENOUS | Status: DC | PRN
  Administered 2016-05-16: 1000 mL

## 2016-05-16 MED ORDER — SODIUM CHLORIDE 0.9 % IV SOLN
INTRAVENOUS | Status: DC | PRN
  Administered 2016-05-16: .7 [IU]/h via INTRAVENOUS

## 2016-05-16 MED ORDER — MANNITOL 20% IV SOLUTION 10G/50ML
6.4000 g | Freq: Once | INTRAVENOUS | Status: DC
  Filled 2016-05-16: qty 60

## 2016-05-16 MED ORDER — SODIUM CHLORIDE 0.9 % IR SOLN
Status: DC | PRN
  Administered 2016-05-16 (×2): 3000 mL

## 2016-05-16 MED ORDER — INSULIN REGULAR HUMAN 100 UNIT/ML IJ SOLN
INTRAMUSCULAR | Status: DC
  Filled 2016-05-16: qty 2.5

## 2016-05-16 MED ORDER — EPHEDRINE 5 MG/ML INJ
INTRAVENOUS | Status: AC
  Filled 2016-05-16: qty 10

## 2016-05-16 MED ORDER — DEXMEDETOMIDINE HCL IN NACL 200 MCG/50ML IV SOLN
INTRAVENOUS | Status: AC
  Filled 2016-05-16: qty 50

## 2016-05-16 MED ORDER — CHLORHEXIDINE GLUCONATE CLOTH 2 % EX PADS
6.0000 | MEDICATED_PAD | Freq: Every day | CUTANEOUS | Status: AC
  Administered 2016-05-16 – 2016-05-20 (×5): 6 via TOPICAL

## 2016-05-16 MED ORDER — MIDAZOLAM HCL 5 MG/5ML IJ SOLN
INTRAMUSCULAR | Status: DC | PRN
  Administered 2016-05-16 (×2): 2 mg via INTRAVENOUS
  Administered 2016-05-16: 1 mg via INTRAVENOUS
  Administered 2016-05-16: 2 mg via INTRAVENOUS
  Administered 2016-05-16: 1 mg via INTRAVENOUS
  Administered 2016-05-16 (×2): 2 mg via INTRAVENOUS

## 2016-05-16 MED ORDER — MORPHINE SULFATE (PF) 2 MG/ML IV SOLN
1.0000 mg | INTRAVENOUS | Status: DC | PRN
  Administered 2016-05-17 (×5): 2 mg via INTRAVENOUS
  Filled 2016-05-16 (×6): qty 1

## 2016-05-16 MED ORDER — PHENYLEPHRINE HCL 10 MG/ML IJ SOLN
INTRAVENOUS | Status: DC | PRN
  Administered 2016-05-16: 25 ug/min via INTRAVENOUS

## 2016-05-16 MED ORDER — ROCURONIUM BROMIDE 50 MG/5ML IV SOSY
PREFILLED_SYRINGE | INTRAVENOUS | Status: AC
  Filled 2016-05-16: qty 5

## 2016-05-16 MED ORDER — HEPARIN SODIUM (PORCINE) 1000 UNIT/ML IJ SOLN
INTRAMUSCULAR | Status: DC | PRN
  Administered 2016-05-16: 34000 [IU] via INTRAVENOUS

## 2016-05-16 MED ORDER — MUPIROCIN 2 % EX OINT
1.0000 "application " | TOPICAL_OINTMENT | Freq: Two times a day (BID) | CUTANEOUS | Status: AC
  Administered 2016-05-16 – 2016-05-21 (×10): 1 via NASAL
  Filled 2016-05-16: qty 22

## 2016-05-16 MED ORDER — PROPOFOL 10 MG/ML IV BOLUS
INTRAVENOUS | Status: AC
  Filled 2016-05-16: qty 20

## 2016-05-16 MED ORDER — SODIUM CHLORIDE 0.9% FLUSH: 3.0000 mL | INTRAVENOUS | Status: DC | PRN

## 2016-05-16 MED ORDER — SODIUM CHLORIDE 0.9 % IV SOLN
30.0000 meq | Freq: Once | INTRAVENOUS | Status: DC
  Filled 2016-05-16: qty 15

## 2016-05-16 MED ORDER — BUPIVACAINE HCL (PF) 0.5 % IJ SOLN
INTRAMUSCULAR | Status: DC | PRN
  Administered 2016-05-16: 10 mL

## 2016-05-16 MED ORDER — DEXMEDETOMIDINE HCL IN NACL 400 MCG/100ML IV SOLN
INTRAVENOUS | Status: DC | PRN
  Administered 2016-05-16: .3 ug/kg/h via INTRAVENOUS

## 2016-05-16 MED ORDER — TRAMADOL HCL 50 MG PO TABS
50.0000 mg | ORAL_TABLET | ORAL | Status: DC | PRN
  Administered 2016-05-17: 100 mg via ORAL
  Administered 2016-05-18 (×2): 50 mg via ORAL
  Administered 2016-05-20: 100 mg via ORAL
  Filled 2016-05-16 (×2): qty 2
  Filled 2016-05-16: qty 1
  Filled 2016-05-16: qty 2

## 2016-05-16 MED ORDER — BISACODYL 5 MG PO TBEC
10.0000 mg | DELAYED_RELEASE_TABLET | Freq: Every day | ORAL | Status: DC
  Administered 2016-05-17 – 2016-05-22 (×6): 10 mg via ORAL
  Filled 2016-05-16 (×6): qty 2

## 2016-05-16 MED ORDER — SODIUM CHLORIDE 0.9 % IV SOLN: INTRAVENOUS | Status: DC

## 2016-05-16 MED ORDER — METOPROLOL TARTRATE 12.5 MG HALF TABLET: 12.5000 mg | ORAL_TABLET | Freq: Two times a day (BID) | ORAL | Status: DC

## 2016-05-16 MED ORDER — ALBUMIN HUMAN 5 % IV SOLN
INTRAVENOUS | Status: DC | PRN
  Administered 2016-05-16: 16:00:00 via INTRAVENOUS

## 2016-05-16 MED ORDER — MAGNESIUM SULFATE 4 GM/100ML IV SOLN
4.0000 g | Freq: Once | INTRAVENOUS | Status: AC
  Administered 2016-05-16: 4 g via INTRAVENOUS
  Filled 2016-05-16: qty 100

## 2016-05-16 MED ORDER — CHLORHEXIDINE GLUCONATE 0.12% ORAL RINSE (MEDLINE KIT)
15.0000 mL | Freq: Two times a day (BID) | OROMUCOSAL | Status: DC
  Administered 2016-05-16 – 2016-05-17 (×2): 15 mL via OROMUCOSAL

## 2016-05-16 MED ORDER — PROTAMINE SULFATE 10 MG/ML IV SOLN
INTRAVENOUS | Status: DC | PRN
  Administered 2016-05-16: 30 mg via INTRAVENOUS

## 2016-05-16 MED ORDER — DEXMEDETOMIDINE HCL IN NACL 200 MCG/50ML IV SOLN
0.0000 ug/kg/h | INTRAVENOUS | Status: DC
Start: 2016-05-16 — End: 2016-05-17
  Administered 2016-05-17: 0.2 ug/kg/h via INTRAVENOUS
  Filled 2016-05-16: qty 50

## 2016-05-16 MED ORDER — ACETAMINOPHEN 650 MG RE SUPP
650.0000 mg | Freq: Once | RECTAL | Status: AC
  Administered 2016-05-16: 650 mg via RECTAL

## 2016-05-16 MED ORDER — SUCCINYLCHOLINE CHLORIDE 200 MG/10ML IV SOSY
PREFILLED_SYRINGE | INTRAVENOUS | Status: AC
  Filled 2016-05-16: qty 10

## 2016-05-16 MED ORDER — LACTATED RINGERS IV SOLN
INTRAVENOUS | Status: DC | PRN
  Administered 2016-05-16: 08:00:00 via INTRAVENOUS

## 2016-05-16 MED ORDER — DOPAMINE-DEXTROSE 3.2-5 MG/ML-% IV SOLN
0.0000 ug/kg/min | INTRAVENOUS | Status: DC
Start: 2016-05-16 — End: 2016-05-19
  Filled 2016-05-16: qty 250

## 2016-05-16 MED ORDER — ROCURONIUM BROMIDE 100 MG/10ML IV SOLN
INTRAVENOUS | Status: DC | PRN
  Administered 2016-05-16 (×2): 50 mg via INTRAVENOUS
  Administered 2016-05-16 (×2): 20 mg via INTRAVENOUS
  Administered 2016-05-16 (×2): 50 mg via INTRAVENOUS

## 2016-05-16 MED ORDER — SODIUM CHLORIDE 0.9 % IV SOLN
INTRAVENOUS | Status: DC | PRN
  Administered 2016-05-16 (×2): via INTRAVENOUS

## 2016-05-16 MED ORDER — PROPOFOL 10 MG/ML IV BOLUS
INTRAVENOUS | Status: DC | PRN
Start: 2016-05-16 — End: 2016-05-16
  Administered 2016-05-16: 50 mg via INTRAVENOUS

## 2016-05-16 MED ORDER — PHENYLEPHRINE HCL 10 MG/ML IJ SOLN
INTRAVENOUS | Status: DC | PRN
  Administered 2016-05-16: 40 ug/min via INTRAVENOUS

## 2016-05-16 MED ORDER — FAMOTIDINE IN NACL 20-0.9 MG/50ML-% IV SOLN
20.0000 mg | Freq: Two times a day (BID) | INTRAVENOUS | Status: AC
  Administered 2016-05-16: 20 mg via INTRAVENOUS

## 2016-05-16 MED ORDER — MILRINONE LACTATE IN DEXTROSE 20-5 MG/100ML-% IV SOLN
0.1000 ug/kg/min | INTRAVENOUS | Status: DC
  Administered 2016-05-17: 0.2 ug/kg/min via INTRAVENOUS
  Administered 2016-05-17: 0.3 ug/kg/min via INTRAVENOUS
  Filled 2016-05-16 (×2): qty 100

## 2016-05-16 MED ORDER — PANTOPRAZOLE SODIUM 40 MG PO TBEC
40.0000 mg | DELAYED_RELEASE_TABLET | Freq: Every day | ORAL | Status: DC
  Administered 2016-05-18 – 2016-05-22 (×5): 40 mg via ORAL
  Filled 2016-05-16 (×6): qty 1

## 2016-05-16 MED ORDER — PHENYLEPHRINE HCL 10 MG/ML IJ SOLN
0.0000 ug/min | INTRAMUSCULAR | Status: DC
  Administered 2016-05-17 (×2): 50 ug/min via INTRAVENOUS
  Administered 2016-05-17: 70 ug/min via INTRAVENOUS
  Administered 2016-05-17: 60 ug/min via INTRAVENOUS
  Administered 2016-05-17: 50 ug/min via INTRAVENOUS
  Administered 2016-05-18: 40 ug/min via INTRAVENOUS
  Filled 2016-05-16 (×6): qty 2

## 2016-05-16 MED ORDER — FENTANYL CITRATE (PF) 100 MCG/2ML IJ SOLN
INTRAMUSCULAR | Status: DC | PRN
  Administered 2016-05-16: 50 ug via INTRAVENOUS
  Administered 2016-05-16: 200 ug via INTRAVENOUS
  Administered 2016-05-16 (×2): 50 ug via INTRAVENOUS
  Administered 2016-05-16: 300 ug via INTRAVENOUS
  Administered 2016-05-16: 100 ug via INTRAVENOUS
  Administered 2016-05-16: 50 ug via INTRAVENOUS

## 2016-05-16 MED ORDER — 0.9 % SODIUM CHLORIDE (POUR BTL) OPTIME
TOPICAL | Status: DC | PRN
  Administered 2016-05-16: 5000 mL

## 2016-05-16 MED ORDER — BUPIVACAINE HCL (PF) 0.5 % IJ SOLN
INTRAMUSCULAR | Status: AC
  Filled 2016-05-16: qty 30

## 2016-05-16 MED ORDER — EPHEDRINE SULFATE 50 MG/ML IJ SOLN
INTRAMUSCULAR | Status: DC | PRN
  Administered 2016-05-16 (×2): 5 mg via INTRAVENOUS

## 2016-05-16 MED ORDER — ONDANSETRON HCL 4 MG/2ML IJ SOLN
4.0000 mg | Freq: Four times a day (QID) | INTRAMUSCULAR | Status: DC | PRN
  Administered 2016-05-17 – 2016-05-18 (×3): 4 mg via INTRAVENOUS
  Filled 2016-05-16 (×4): qty 2

## 2016-05-16 MED ORDER — DOCUSATE SODIUM 100 MG PO CAPS
200.0000 mg | ORAL_CAPSULE | Freq: Every day | ORAL | Status: DC
  Administered 2016-05-17 – 2016-05-22 (×5): 200 mg via ORAL
  Filled 2016-05-16 (×5): qty 2

## 2016-05-16 MED ORDER — ASPIRIN EC 325 MG PO TBEC
325.0000 mg | DELAYED_RELEASE_TABLET | Freq: Every day | ORAL | Status: DC
  Administered 2016-05-17: 325 mg via ORAL
  Filled 2016-05-16: qty 1

## 2016-05-16 MED ORDER — MIDAZOLAM HCL 2 MG/2ML IJ SOLN
INTRAMUSCULAR | Status: AC
  Filled 2016-05-16: qty 4

## 2016-05-16 MED ORDER — SODIUM CHLORIDE 0.9 % IJ SOLN
INTRAMUSCULAR | Status: AC
  Filled 2016-05-16: qty 10

## 2016-05-16 MED ORDER — ARTIFICIAL TEARS OP OINT
TOPICAL_OINTMENT | OPHTHALMIC | Status: AC
  Filled 2016-05-16: qty 10.5

## 2016-05-16 MED ORDER — MIDAZOLAM HCL 2 MG/2ML IJ SOLN
2.0000 mg | INTRAMUSCULAR | Status: DC | PRN
  Filled 2016-05-16: qty 2

## 2016-05-16 MED ORDER — ORAL CARE MOUTH RINSE: 15.0000 mL | Freq: Four times a day (QID) | OROMUCOSAL | Status: DC

## 2016-05-16 MED ORDER — OXYCODONE HCL 5 MG PO TABS
5.0000 mg | ORAL_TABLET | ORAL | Status: DC | PRN
  Administered 2016-05-17: 5 mg via ORAL
  Administered 2016-05-17: 10 mg via ORAL
  Administered 2016-05-17 – 2016-05-19 (×3): 5 mg via ORAL
  Administered 2016-05-20: 10 mg via ORAL
  Administered 2016-05-20 (×4): 5 mg via ORAL
  Administered 2016-05-21 (×3): 10 mg via ORAL
  Administered 2016-05-21 – 2016-05-22 (×3): 5 mg via ORAL
  Administered 2016-05-22: 10 mg via ORAL
  Filled 2016-05-16 (×2): qty 1
  Filled 2016-05-16 (×2): qty 2
  Filled 2016-05-16 (×2): qty 1
  Filled 2016-05-16: qty 2
  Filled 2016-05-16 (×2): qty 1
  Filled 2016-05-16: qty 2
  Filled 2016-05-16: qty 1
  Filled 2016-05-16: qty 2
  Filled 2016-05-16 (×5): qty 1
  Filled 2016-05-16: qty 2
  Filled 2016-05-16 (×2): qty 1

## 2016-05-16 MED ORDER — FENTANYL CITRATE (PF) 250 MCG/5ML IJ SOLN
INTRAMUSCULAR | Status: AC
  Filled 2016-05-16: qty 5

## 2016-05-16 MED ORDER — CHLORHEXIDINE GLUCONATE 0.12 % MT SOLN
15.0000 mL | OROMUCOSAL | Status: AC
  Administered 2016-05-16: 15 mL via OROMUCOSAL

## 2016-05-16 MED ORDER — DEXTROSE 5 % IV SOLN
INTRAVENOUS | Status: DC | PRN
  Administered 2016-05-16 (×2): 1.5 g via INTRAVENOUS

## 2016-05-16 MED ORDER — VANCOMYCIN HCL IN DEXTROSE 1-5 GM/200ML-% IV SOLN
1000.0000 mg | Freq: Once | INTRAVENOUS | Status: AC
  Administered 2016-05-16: 1000 mg via INTRAVENOUS
  Filled 2016-05-16: qty 200

## 2016-05-16 MED ORDER — METOPROLOL TARTRATE 25 MG/10 ML ORAL SUSPENSION: 12.5000 mg | Freq: Two times a day (BID) | ORAL | Status: DC

## 2016-05-16 MED ORDER — BUPIVACAINE 0.5 % ON-Q PUMP SINGLE CATH 400 ML
400.0000 mL | INJECTION | Status: AC
Start: 2016-05-16 — End: 2016-05-16
  Administered 2016-05-16: 400 mL
  Filled 2016-05-16: qty 400

## 2016-05-16 MED ORDER — MILRINONE LACTATE IN DEXTROSE 20-5 MG/100ML-% IV SOLN
0.1250 ug/kg/min | INTRAVENOUS | Status: AC
  Administered 2016-05-16: .3 ug/kg/min via INTRAVENOUS

## 2016-05-16 MED ORDER — SODIUM CHLORIDE 0.9 % IV SOLN
INTRAVENOUS | Status: DC
  Administered 2016-05-16: 17:00:00 via INTRAVENOUS

## 2016-05-16 MED ORDER — NITROGLYCERIN IN D5W 200-5 MCG/ML-% IV SOLN: 0.0000 ug/min | INTRAVENOUS | Status: DC

## 2016-05-16 MED ORDER — SODIUM CHLORIDE 0.9 % IV SOLN: 250.0000 mL | INTRAVENOUS | Status: DC

## 2016-05-16 MED FILL — Magnesium Sulfate Inj 50%: INTRAMUSCULAR | Qty: 2 | Status: AC

## 2016-05-16 MED FILL — Heparin Sodium (Porcine) Inj 1000 Unit/ML: INTRAMUSCULAR | Qty: 30 | Status: AC

## 2016-05-16 MED FILL — Potassium Chloride Inj 2 mEq/ML: INTRAVENOUS | Qty: 40 | Status: AC

## 2016-05-16 SURGICAL SUPPLY — 141 items
ADAPTER CARDIO PERF ANTE/RETRO (ADAPTER) ×10 IMPLANT
ARTICLIP LAA PROCLIP II 45 (Clip) ×5 IMPLANT
ATTRACTOMAT 16X20 MAGNETIC DRP (DRAPES) ×5 IMPLANT
BAG DECANTER FOR FLEXI CONT (MISCELLANEOUS) ×15 IMPLANT
BLADE CLIPPER SURG (BLADE) IMPLANT
BLADE STERNUM SYSTEM 6 (BLADE) ×5 IMPLANT
BLADE SURG 11 STRL SS (BLADE) ×10 IMPLANT
CANISTER SUCT 3000ML PPV (MISCELLANEOUS) ×15 IMPLANT
CANNULA AORTIC ROOT 20012 (MISCELLANEOUS) ×5 IMPLANT
CANNULA ARTERIAL NVNT 3/8 22FR (MISCELLANEOUS) IMPLANT
CANNULA FEM VENOUS REMOTE 22FR (CANNULA) ×5 IMPLANT
CANNULA FEMORAL ART 14 SM (MISCELLANEOUS) ×5 IMPLANT
CANNULA GUNDRY RCSP 15FR (MISCELLANEOUS) ×5 IMPLANT
CANNULA OPTISITE PERFUSION 16F (CANNULA) IMPLANT
CANNULA OPTISITE PERFUSION 18F (CANNULA) ×5 IMPLANT
CANNULA SUMP PERICARDIAL (CANNULA) ×10 IMPLANT
CATH CPB KIT OWEN (MISCELLANEOUS) ×5 IMPLANT
CATH HEART VENT LEFT (CATHETERS) ×4 IMPLANT
CATH KIT ON Q 5IN SLV (PAIN MANAGEMENT) IMPLANT
CATH KIT ON-Q SILVERSOAK 5IN (CATHETERS) ×5 IMPLANT
CATH ROBINSON RED A/P 18FR (CATHETERS) ×20 IMPLANT
CATH THORACIC 36FR (CATHETERS) ×5 IMPLANT
CATH THORACIC 36FR RT ANG (CATHETERS) ×5 IMPLANT
CONN 1/2X1/2X1/2  BEN (MISCELLANEOUS) ×1
CONN 1/2X1/2X1/2 BEN (MISCELLANEOUS) ×4 IMPLANT
CONN 3/8X1/2 ST GISH (MISCELLANEOUS) ×10 IMPLANT
CONN ST 1/4X3/8  BEN (MISCELLANEOUS) ×2
CONN ST 1/4X3/8 BEN (MISCELLANEOUS) ×8 IMPLANT
CONNECTOR 1/2X3/8X1/2 3 WAY (MISCELLANEOUS) ×1
CONNECTOR 1/2X3/8X1/2 3WAY (MISCELLANEOUS) ×4 IMPLANT
CONT SPEC STER OR (MISCELLANEOUS) ×5 IMPLANT
COVER BACK TABLE 24X17X13 BIG (DRAPES) ×5 IMPLANT
CRADLE DONUT ADULT HEAD (MISCELLANEOUS) ×10 IMPLANT
DERMABOND ADVANCED (GAUZE/BANDAGES/DRESSINGS) ×1
DERMABOND ADVANCED .7 DNX12 (GAUZE/BANDAGES/DRESSINGS) ×4 IMPLANT
DEVICE ATRICLIP LAA PRCLPII 45 (Clip) ×4 IMPLANT
DEVICE PMI PUNCTURE CLOSURE (MISCELLANEOUS) ×5 IMPLANT
DEVICE SUT CK QUICK LOAD MINI (Prosthesis & Implant Heart) ×15 IMPLANT
DEVICE TROCAR PUNCTURE CLOSURE (ENDOMECHANICALS) ×5 IMPLANT
DRAIN CHANNEL 28F RND 3/8 FF (WOUND CARE) ×10 IMPLANT
DRAIN CHANNEL 32F RND 10.7 FF (WOUND CARE) IMPLANT
DRAPE BILATERAL SPLIT (DRAPES) ×5 IMPLANT
DRAPE C-ARM 42X72 X-RAY (DRAPES) ×5 IMPLANT
DRAPE CARDIOVASCULAR INCISE (DRAPES) ×1
DRAPE CV SPLIT W-CLR ANES SCRN (DRAPES) ×5 IMPLANT
DRAPE INCISE IOBAN 66X45 STRL (DRAPES) ×15 IMPLANT
DRAPE SLUSH/WARMER DISC (DRAPES) ×10 IMPLANT
DRAPE SRG 135X102X78XABS (DRAPES) ×4 IMPLANT
DRSG COVADERM 4X14 (GAUZE/BANDAGES/DRESSINGS) ×5 IMPLANT
DRSG COVADERM 4X8 (GAUZE/BANDAGES/DRESSINGS) ×5 IMPLANT
ELECT BLADE 6.5 EXT (BLADE) ×5 IMPLANT
ELECT REM PT RETURN 9FT ADLT (ELECTROSURGICAL) ×20
ELECTRODE REM PT RTRN 9FT ADLT (ELECTROSURGICAL) ×16 IMPLANT
FELT TEFLON 1X6 (MISCELLANEOUS) ×10 IMPLANT
FEMORAL VENOUS CANN RAP (CANNULA) IMPLANT
GAUZE SPONGE 4X4 12PLY STRL (GAUZE/BANDAGES/DRESSINGS) ×10 IMPLANT
GLOVE BIO SURGEON STRL SZ 6 (GLOVE) ×15 IMPLANT
GLOVE BIO SURGEON STRL SZ 6.5 (GLOVE) ×25 IMPLANT
GLOVE BIOGEL PI IND STRL 6.5 (GLOVE) ×16 IMPLANT
GLOVE BIOGEL PI INDICATOR 6.5 (GLOVE) ×4
GLOVE ORTHO TXT STRL SZ7.5 (GLOVE) ×30 IMPLANT
GOWN STRL REUS W/ TWL LRG LVL3 (GOWN DISPOSABLE) ×32 IMPLANT
GOWN STRL REUS W/TWL LRG LVL3 (GOWN DISPOSABLE) ×8
HEMOSTAT POWDER SURGIFOAM 1G (HEMOSTASIS) ×15 IMPLANT
INSERT FOGARTY XLG (MISCELLANEOUS) ×5 IMPLANT
IV NS 1000ML (IV SOLUTION) ×1
IV NS 1000ML BAXH (IV SOLUTION) ×4 IMPLANT
KIT BASIN OR (CUSTOM PROCEDURE TRAY) ×10 IMPLANT
KIT DILATOR VASC 18G NDL (KITS) ×5 IMPLANT
KIT DRAINAGE VACCUM ASSIST (KITS) ×5 IMPLANT
KIT ROOM TURNOVER OR (KITS) ×10 IMPLANT
KIT SUCTION CATH 14FR (SUCTIONS) ×10 IMPLANT
KIT SUT CK MINI COMBO 4X17 (Prosthesis & Implant Heart) ×5 IMPLANT
LEAD PACING MYOCARDI (MISCELLANEOUS) ×10 IMPLANT
LINE VENT (MISCELLANEOUS) ×5 IMPLANT
NDL SUT 1 .5 CRC FRENCH EYE (NEEDLE) ×12 IMPLANT
NEEDLE AORTIC ROOT 14G 7F (CATHETERS) ×5 IMPLANT
NEEDLE FRENCH EYE (NEEDLE) ×3
NS IRRIG 1000ML POUR BTL (IV SOLUTION) ×50 IMPLANT
PACK OPEN HEART (CUSTOM PROCEDURE TRAY) ×10 IMPLANT
PAD ARMBOARD 7.5X6 YLW CONV (MISCELLANEOUS) ×20 IMPLANT
PAD ELECT DEFIB RADIOL ZOLL (MISCELLANEOUS) ×5 IMPLANT
RING ANNULOPLASTYSEMIRIGID 36M (Prosthesis & Implant Heart) IMPLANT
RING MITRAL MEMO 3D 34MM SMD34 (Prosthesis & Implant Heart) ×5 IMPLANT
SET CANNULATION TOURNIQUET (MISCELLANEOUS) ×5 IMPLANT
SET CARDIOPLEGIA MPS 5001102 (MISCELLANEOUS) ×5 IMPLANT
SET IRRIG TUBING LAPAROSCOPIC (IRRIGATION / IRRIGATOR) ×5 IMPLANT
SOLUTION ANTI FOG 6CC (MISCELLANEOUS) ×10 IMPLANT
SPONGE GAUZE 4X4 12PLY STER LF (GAUZE/BANDAGES/DRESSINGS) ×5 IMPLANT
SUCKER INTRACARDIAC WEIGHTED (SUCKER) ×5 IMPLANT
SUT BONE WAX W31G (SUTURE) ×10 IMPLANT
SUT E-PACK MINIMALLY INVASIVE (SUTURE) ×5 IMPLANT
SUT ETHIBOND (SUTURE) ×15 IMPLANT
SUT ETHIBOND 2 0 SH (SUTURE) ×5 IMPLANT
SUT ETHIBOND 2 0 SH 36X2 (SUTURE) IMPLANT
SUT ETHIBOND 2 0 V4 (SUTURE) IMPLANT
SUT ETHIBOND 2 0V4 GREEN (SUTURE) IMPLANT
SUT ETHIBOND 2-0 RB-1 WHT (SUTURE) ×15 IMPLANT
SUT ETHIBOND X763 2 0 SH 1 (SUTURE) ×20 IMPLANT
SUT GORETEX CV 4 TH 22 36 (SUTURE) ×5 IMPLANT
SUT GORETEX CV-5THC-13 36IN (SUTURE) ×70 IMPLANT
SUT GORETEX CV4 TH-18 (SUTURE) ×10 IMPLANT
SUT MNCRL AB 3-0 PS2 18 (SUTURE) ×10 IMPLANT
SUT MNCRL AB 4-0 PS2 18 (SUTURE) ×10 IMPLANT
SUT PDS AB 1 CTX 36 (SUTURE) ×10 IMPLANT
SUT PROLENE 3 0 SH DA (SUTURE) ×10 IMPLANT
SUT PROLENE 3 0 SH1 36 (SUTURE) ×35 IMPLANT
SUT PROLENE 4 0 RB 1 (SUTURE) ×4
SUT PROLENE 4-0 RB1 .5 CRCL 36 (SUTURE) ×16 IMPLANT
SUT PROLENE 5 0 CC1 (SUTURE) IMPLANT
SUT PTFE CHORD X 24MM (SUTURE) ×10 IMPLANT
SUT SILK  1 MH (SUTURE) ×2
SUT SILK 1 MH (SUTURE) ×8 IMPLANT
SUT SILK 2 0 SH CR/8 (SUTURE) IMPLANT
SUT SILK 3 0 SH CR/8 (SUTURE) IMPLANT
SUT STEEL 6MS V (SUTURE) IMPLANT
SUT VIC AB 2-0 CTX 27 (SUTURE) ×5 IMPLANT
SUT VIC AB 2-0 CTX 36 (SUTURE) IMPLANT
SUT VIC AB 3-0 SH 8-18 (SUTURE) IMPLANT
SUT VIC AB 3-0 X1 27 (SUTURE) IMPLANT
SUT VICRYL 2 TP 1 (SUTURE) IMPLANT
SUTURE E-PAK OPEN HEART (SUTURE) ×5 IMPLANT
SYR 10ML LL (SYRINGE) ×5 IMPLANT
SYSTEM SAHARA CHEST DRAIN ATS (WOUND CARE) ×10 IMPLANT
TAPE CLOTH SURG 4X10 WHT LF (GAUZE/BANDAGES/DRESSINGS) ×5 IMPLANT
TAPE PAPER 2X10 WHT MICROPORE (GAUZE/BANDAGES/DRESSINGS) ×5 IMPLANT
TOWEL GREEN STERILE (TOWEL DISPOSABLE) ×20 IMPLANT
TOWEL GREEN STERILE FF (TOWEL DISPOSABLE) ×10 IMPLANT
TOWEL OR 17X24 6PK STRL BLUE (TOWEL DISPOSABLE) ×15 IMPLANT
TOWEL OR 17X26 10 PK STRL BLUE (TOWEL DISPOSABLE) ×20 IMPLANT
TRAY FOLEY IC TEMP SENS 14FR (CATHETERS) IMPLANT
TRAY FOLEY IC TEMP SENS 16FR (CATHETERS) ×5 IMPLANT
TROCAR XCEL BLADELESS 5X75MML (TROCAR) ×5 IMPLANT
TROCAR XCEL NON-BLD 11X100MML (ENDOMECHANICALS) ×10 IMPLANT
TUBE SUCT INTRACARD DLP 20F (MISCELLANEOUS) ×5 IMPLANT
TUBING INSUFFLATION 10FT LAP (TUBING) ×5 IMPLANT
TUNNELER SHEATH ON-Q 11GX8 DSP (PAIN MANAGEMENT) ×5 IMPLANT
UNDERPAD 30X30 (UNDERPADS AND DIAPERS) ×10 IMPLANT
VENT LEFT HEART 12002 (CATHETERS) ×5
WATER STERILE IRR 1000ML POUR (IV SOLUTION) ×10 IMPLANT
WIRE .035 3MM-J 145CM (WIRE) ×5 IMPLANT

## 2016-05-16 NOTE — Anesthesia Procedure Notes (Signed)
Central Venous Catheter Insertion Performed by: Kipp BroodJOSLIN, Blair Mesina, anesthesiologist Start/End3/09/2016 8:00 AM, 05/16/2016 8:10 AM Preanesthetic checklist: patient identified, IV checked, site marked, risks and benefits discussed, surgical consent, monitors and equipment checked, pre-op evaluation and timeout performed Position: Trendelenburg Hand hygiene performed , maximum sterile barriers used  and Seldinger technique used Catheter size: 8.5 Fr PA cath was placed.Sheath introducer PA Cath depth:48 Attempts: 1 Following insertion, line sutured and dressing applied. Post procedure assessment: blood return through all ports, free fluid flow and no air  Patient tolerated the procedure well with no immediate complications.

## 2016-05-16 NOTE — Progress Notes (Signed)
Recruitment maneuver done at 2007 to 2009. Pt tolerated well, RT then turned FIO2 to 70% pt tolerating well at this time. RT and RN to continue to monitor

## 2016-05-16 NOTE — Op Note (Addendum)
CARDIOTHORACIC SURGERY OPERATIVE NOTE  Date of Procedure:  05/16/2016  Preoperative Diagnosis:   Severe Mitral Regurgitation  Secundum Type Atrial Septal Defect  Postoperative Diagnosis: Same  Procedure:    Minimally-Invasive Mitral Valve Repair  Complex valvuloplasty including triangular resection of flail segment of posterior leaflet  Artificial Gore-tex neochord placement x6  Sorin Memo 3D Ring Annuloplasty (size 34 mm, catalog # [id number], serial # [id number])  Closure of atrial septal defect  Clipping of left atrial appendage   Surgeon: Salvatore Decent. 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 Patient states that he has known about the presence of a heart murmur for nearly 20 years. Over the past year he has noted the development of gradual progression of symptoms of exertional shortness of breath and occasional chest tightness. He was seen in follow-up by his primary care physician and a transthoracic echocardiogram performed demonstrating the presence of mitral valve prolapse with what appear to be a flail segment of posterior leaflet and  severe mitral regurgitation. Left ventricular systolic function remained normal with ejection fraction estimated 65%. There was flow reversal in the pulmonary veins, severe left atrial enlargement, and mildly enlarged left ventricle. The patient subsequently underwent transesophageal echocardiogram and diagnostic cardiac catheterization. TEE confirmed the presence of myxomatous degenerative disease of the mitral valve with a large flail segment involving the middle scallop of the posterior leaflet with multiple ruptured chordae tendineae. There was severe mitral regurgitation. There was flow reversal in the pulmonary veins. The regurgitant volume was calculated 197 mL. There was severe left atrial enlargement. There was a small atrial septal defect versus stretched patent foramen ovale. Left ventricular systolic function appeared normal with ejection fraction calculated 55-65%. No other significant abnormalities were noted. Diagnostic cardiac catheterization was notable for the presence of normal coronary arteries with no significant coronary artery disease. Right heart catheterization was not performed. The patient was referred for surgical consultation and has seen a Careers adviser in [place]. The patient has requested a second opinion with interested in exploring the possibility of minimally invasive approach for surgery. The patient has been seen in consultation and counseled at length regarding the indications, risks and potential benefits of surgery.  All questions have been answered, and the patient provides full informed consent for the operation as described.    DETAILS OF THE OPERATIVE PROCEDURE  Preparation:  The patient is brought to the operating room on the above mentioned date and central monitoring was established by the anesthesia team including placement of Swan-Ganz catheter through the left internal jugular vein.  The patient was noted to have moderate pulmonary hypertension at  baseline with PA pressures measured in the 60s systolic. A radial arterial line is  placed. The patient is placed in the supine position on the operating table.  Intravenous antibiotics are administered. General endotracheal anesthesia is induced uneventfully. The patient is initially intubated using a dual lumen endotracheal tube.  A Foley catheter is placed.  Baseline transesophageal echocardiogram was performed.  Findings were notable for myxomatous degenerative disease of the mitral valve with a large flail segment involving the middle scallop of the posterior leaflet causing severe mitral regurgitation. There were multiple ruptured chordae tendineae. The regurgitant volume was estimated 110 mL. There was moderate left ventricular chamber enlargement with low normal left ventricular systolic function. There was left atrial enlargement. There was a small fenestrated secundum type atrial septal defect or large patent foramen ovale with left-to-right shunting. The aortic valve appeared normal. Right ventricular size and function was normal. There was trivial tricuspid regurgitation.  A soft roll is placed behind the patient's left scapula and the neck gently extended and turned to the left.   The patient's right neck, chest, abdomen, both groins, and both lower extremities are prepared and draped in a sterile manner. A time out procedure is performed.  Surgical Approach:  A right miniature anterolateral thoracotomy incision is performed. The incision is placed just lateral to and superior to the right nipple. The pectoralis major muscle is retracted medially and completely preserved. The right pleural space is entered through the 3rd intercostal space. A soft tissue retractor is placed.  Two 11 mm ports are placed through separate stab incisions inferiorly. The right pleural space is insufflated continuously with carbon dioxide gas through the posterior port during the remainder of the operation.  A  pledgeted sutures placed through the dome of the right hemidiaphragm and retracted inferiorly to facilitate exposure.  A longitudinal incision is made in the pericardium 3 cm anterior to the phrenic nerve and silk traction sutures are placed on either side of the incision for exposure.   Extracorporeal Cardiopulmonary Bypass:  A small incision is made in the right inguinal crease and the anterior surface of the right common femoral artery and right common femoral vein are identified.  The patient is placed in Trendelenburg position. The right internal jugular vein is cannulated with Seldinger technique and a guidewire advanced into the right atrium. The patient is heparinized systemically. The right internal jugular vein is cannulated with a 14 [place] pediatric femoral venous cannula. Pursestring sutures are placed on the anterior surface of the right common femoral vein and right common femoral artery. The right common femoral vein is cannulated with the Seldinger technique and a guidewire is advanced under transesophageal echocardiogram guidance through the right atrium. The femoral vein is cannulated with a long 22 French femoral venous cannula. The right common femoral artery is cannulated with Seldinger technique and a flexible guidewire is advanced until it can be appreciated intraluminally in the descending thoracic aorta on transesophageal echocardiogram. The femoral artery is cannulated with an 18 French femoral arterial cannula.  Adequate heparinization is verified.     The entire pre-bypass portion of the operation was notable for stable hemodynamics.  Cardiopulmonary bypass was begun.  Vacuum assist venous drainage is utilized. The incision in the pericardium is extended in both directions. Venous drainage and exposure are notably excellent.    Clipping of Left Atrial Appendage:  The left atrial appendage is obliterated using the left atrial clip (Atricure Pro245) placed under direct vision  through the transverse sinus. After application of the clip complete obliteration was confirmed using transesophageal echocardiogram.   Myocardial Protection:  A retrograde cardioplegia cannula is placed through the right atrium into the coronary sinus using transesophageal echocardiogram guidance.  An antegrade cardioplegia cannula is placed in the ascending aorta.    The patient is cooled to 28C systemic temperature.  The aortic cross clamp is applied and cardioplegia is delivered initially in an antegrade fashion through the aortic root using modified [place] cold blood cardioplegia (KBC protocol).   The initial cardioplegic arrest is rapid with early diastolic arrest.  Repeat doses of cardioplegia are administered at 90 minutes and every 30 minutes thereafter through the coronary sinus catheter in order to maintain completely flat electrocardiogram.  Myocardial protection was felt to be excellent.   Closure of Atrial Septal Defect:  A left atriotomy incision was performed through the interatrial groove and extended partially across the back wall of the left atrium after opening the oblique sinus inferiorly.  There was an obvious fenestrated secundum type atrial septal defect immediately visible upon entry into the left atrium. The atrial septal defect was closed primarily using a 2 layer closure of running 3-0 Prolene suture.   Mitral Valve Repair:  The mitral valve is exposed using a self-retaining retractor.  The mitral valve was inspected and notable for fibroelastic deficiency type myxomatous degenerative disease. There are multiple ruptured primary chordae tendineae involving the majority of the middle scallop (P2) of the posterior leaflet. The ruptured chordae tendineae on. To have originated from the posterior papillary muscle. There is mild thickening of the anterior leaflet with normal leaflet mobility. There is no prolapse or elongation involving any other segments of the valve  .  Interrupted 2-0 Ethibond horizontal mattress sutures are placed circumferentially around the entire mitral valve annulus. The sutures will ultimately be utilized for ring annuloplasty, and at this juncture there are utilized to suspend the valve symmetrically.  The large flail segment of the posterior leaflet is repaired using a simple triangular resection. Approximately 35% of the total surface area of P2 is removed. The intervening vertical defect in the posterior leaflet is closed using simple interrupted everting CV 5 Gore-Tex suture.  Artificial neochord placement was performed using Chord-X multi-strand CV-4 Goretex pre-measured loops.  The appropriate cord length was measured from corresponding normal length primary cords from the P1 segment of the posterior leaflet. The papillary muscle suture of the Chord-X multi-strand suture was placed through the head of the posterior papillary muscle in a horizontal mattress fashion and tied over Teflon felt pledgets. Each of the three pre-measured loops were then reimplanted into the free margin of the P2 segment of the posterior leaflet.    The valve was tested with saline and appeared competent even without ring annuloplasty complete. The valve was sized to a 36 mm annuloplasty ring, based upon the transverse distance between the left and right commissures and the height of the anterior leaflet, corresponding to a size just slightly larger than the overall surface area of the anterior leaflet.  A Sorin Memo 3D annuloplasty ring (size 36mm, catalog [id number], serial [id number]) was lowered into place. The valve was tested with saline and there appeared to be inadequate total surface area of coaptation due to oversizing of the ring. The ring was removed prior to securing any of the sutures and downsize to a 34 mm ring.  A Sorin Memo 3D annuloplasty ring (size 34mm, catalog [id number], serial [id number]) was secured in place uneventfully. All ring sutures were secured  using a Cor-knot device.  The valve was tested with saline and appeared competent.  However, there are appeared to be some restriction of the posterior leaflet beneath the closure of the P2 segment. Several of the CV 5 sutures were removed and some secondary chordae tendineae were released. The posterior leaflet was again repaired with CV 5 Gore-Tex suture and the leaflet restriction appeared improved.  The valve is again tested with saline and appears to be perfectly competent with a broad symmetrical line of coaptation of the anterior and posterior leaflet. There is no residual leak. There was a broad, symmetrical line of coaptation of the anterior and posterior leaflet which was confirmed using the blue ink test.  Rewarming is begun.   Procedure Completion:  The atriotomy was closed using a 2-layer closure of running 3-0 Prolene suture after placing a sump drain across the mitral valve to serve as a left ventricular vent.  One final dose of warm retrograde "hot shot" cardioplegia was administered retrograde through the coronary sinus catheter while all air was evacuated through the aortic root.  The aortic cross clamp was removed after a total cross clamp time of 201 minutes.  Epicardial pacing wires are fixed to the inferior wall of the right ventricule and to the right atrial appendage. The patient is rewarmed to 37C temperature. The left ventricular vent is removed.  The patient is ventilated and flow volumes turndown while the mitral valve repair is inspected using transesophageal echocardiogram. The valve repair appears intact with no residual leak. The antegrade cardioplegia cannula is now removed. The patient is weaned and disconnected from cardiopulmonary bypass.  The patient's rhythm at separation from bypass was AV paced.  The patient was weaned from bypass without any inotropic support. Total cardiopulmonary bypass time for the operation was 242 minutes.    Followup transesophageal  echocardiogram performed after separation from bypass revealed a well-seated annuloplasty ring in the mitral position with a normal functioning mitral valve. There was trivial residual leak.  Left ventricular function was unchanged from preoperatively.  The mean gradient across the mitral valve was estimated to be 4 mmHg.  The femoral arterial and venous cannulae were removed uneventfully. There was a palpable pulse in the distal right common femoral artery after removal of the cannula. Protamine was administered to reverse the anticoagulation. The right internal jugular cannula was removed and manual pressure held on the neck for 15 minutes.  Single lung ventilation was begun. The atriotomy closure was inspected for hemostasis. The pericardial sac was drained using a 28 French Bard drain placed through the anterior port incision.  The pericardium was closed using a patch of core matrix bovine submucosal tissue patch. The right pleural space is irrigated with saline solution and inspected for hemostasis. The right pleural space was drained using a 28 French Bard drain placed through the posterior port incision. The miniature thoracotomy incision was closed in multiple layers in routine fashion. The right groin incision was inspected for hemostasis and closed in multiple layers in routine fashion.  The post-bypass portion of the operation was notable for stable rhythm and hemodynamics.  Low-dose milrinone and dopamine infusions were initiated because of borderline low cardiac output.  No blood products were administered during the operation.   Disposition:  The patient tolerated the procedure well.  The patient was reintubated using a single lumen endotracheal tube and subsequently transported to the surgical intensive care unit in stable condition. There were no intraoperative complications. All sponge instrument and needle counts are verified correct at completion of the operation.     Salvatore Decent.  Cornelius Moras MD 05/16/2016 4:01 PM

## 2016-05-16 NOTE — Anesthesia Postprocedure Evaluation (Signed)
Anesthesia Post Note  Patient: Trevor Melton  Procedure(s) Performed: Procedure(s) (LRB): MINIMALLY INVASIVE MITRAL VALVE REPAIR (MVR) (Right) ATRIAL SEPTAL DEFECT (ASD) REPAIR (N/A) TRANSESOPHAGEAL ECHOCARDIOGRAM (TEE) (N/A) CLIPPING OF ATRIAL APPENDAGE  Patient location during evaluation: SICU Anesthesia Type: General Level of consciousness: patient remains intubated per anesthesia plan Vital Signs Assessment: post-procedure vital signs reviewed and stable Respiratory status: patient on ventilator - see flowsheet for VS and patient remains intubated per anesthesia plan Cardiovascular status: blood pressure returned to baseline Anesthetic complications: no       Last Vitals:  Vitals:   05/16/16 1820 05/16/16 1825  BP: (!) 94/58   Pulse: 80 80  Resp: (!) 0 (!) 5  Temp: (!) 35.9 C (!) 35.9 C    Last Pain: There were no vitals filed for this visit.               Lidya Mccalister COKER

## 2016-05-16 NOTE — Anesthesia Procedure Notes (Signed)
Central Venous Catheter Insertion Performed by: Noreene LarssonJOSLIN, Quintavia Rogstad Start/End3/09/2016 8:10 AM, 05/16/2016 8:15 AM Patient location: Pre-op. Preanesthetic checklist: patient identified, IV checked, site marked, risks and benefits discussed, surgical consent, monitors and equipment checked, pre-op evaluation and timeout performed Position: Trendelenburg Lidocaine 1% used for infiltration Hand hygiene performed , maximum sterile barriers used  and Seldinger technique used Catheter size: 8 Fr Central line was placed.Double lumen Procedure performed using ultrasound guided technique. Ultrasound Notes:anatomy identified, needle tip was noted to be adjacent to the nerve/plexus identified and no ultrasound evidence of intravascular and/or intraneural injection Attempts: 1 Following insertion, line sutured and dressing applied. Post procedure assessment: blood return through all ports, free fluid flow and no air  Patient tolerated the procedure well with no immediate complications.

## 2016-05-16 NOTE — H&P (Signed)
[address]       [name]             [phone]          CARDIOTHORACIC SURGERY HISTORY AND PHYSICAL EXAM  Referring Provider is Turner, Bernestine Amass., MD  Primary Cardiologist is McRae III, A. Maisie Fus, MD PCP is Lucretia Field, MD      Chief Complaint  Patient presents with  . Mitral Regurgitation    Surgical eval, 2ND opionion, Cardiac Cath 04/06/16, TEE 04/06/16 @ [facility] in [place]      HPI:  Patient is a 56 year old male with known history of mitral valve prolapse who has been referred for second opinion for possible surgical treatment of severe symptomatic primary mitral regurgitation. Patient states that he has known about the presence of a heart murmur for nearly 20 years. Over the past year he has noted the development of gradual progression of symptoms of exertional shortness of breath and occasional chest tightness. He was seen in follow-up by his primary care physician and a transthoracic echocardiogram performed demonstrating the presence of mitral valve prolapse with what appear to be a flail segment of posterior leaflet and severe mitral regurgitation.  Left ventricular systolic function remained normal with ejection fraction estimated 65%. There was flow reversal in the pulmonary veins, severe left atrial enlargement, and mildly enlarged left ventricle. The patient subsequently underwent transesophageal echocardiogram and diagnostic cardiac catheterization. TEE confirmed the presence of myxomatous degenerative disease of the mitral valve with a large flail segment involving the middle scallop of the posterior leaflet with multiple ruptured chordae tendineae. There was severe mitral regurgitation. There was flow reversal in the pulmonary veins. The regurgitant volume was calculated 197 mL. There was severe left atrial enlargement. There was a small atrial septal defect versus stretched patent foramen ovale. Left  ventricular systolic function appeared normal with ejection fraction calculated 55-65%. No other significant abnormalities were noted. Diagnostic cardiac catheterization was notable for the presence of normal coronary arteries with no significant coronary artery disease. Right heart catheterization was not performed. The patient was referred for surgical consultation and has seen a Careers adviser in [place]. The patient has requested a second opinion with interested in exploring the possibility of minimally invasive approach for surgery.  The patient is single and lives alone in [place]. He is a Technical sales engineer. He does not exercise on a regular basis but he reports no significant physical limitations other than problems with exertional shortness of breath and fatigue. The patient states that he has had a long history of some degree of exertional shortness of breath. Symptoms have gradually progressed over the past year, particularly over the last 4-6 weeks. The patient now gets short of breath just going up a single flight of steps. He denies any history of resting shortness of breath, PND, orthopnea, or lower extremity edema. He has had some transient dizzy spells without syncope. He reports some mild tightness across his chest that seems to be sporadic and not necessarily associated with physical activity or shortness of breath. He recently has had productive cough for the last several weeks. He thinks he may have had a flulike syndrome several weeks ago. All of his symptoms have resolved with exception of a mild residual cough.   Past Medical History:  Diagnosis Date  . Atrial septal defect   . Chronic kidney disease (CKD) 05/14/2016  . Diverticulosis   . Dyspnea   . H/O  seasonal allergies   . Heart murmur   . Mitral valve disease   . Severe mitral regurgitation 04/18/2016  . Sinus disease   . Strabismus     Past Surgical History:  Procedure Laterality Date  . COLONOSCOPY    . EYE  SURGERY     muscle  . NASAL SINUS SURGERY      Family History  Problem Relation Age of Onset  . Hypertension Mother   . Heart disease Paternal Grandfather     Social History Social History  Substance Use Topics  . Smoking status: Never Smoker  . Smokeless tobacco: Never Used  . Alcohol use No    Prior to Admission medications   Medication Sig Start Date End Date Taking? Authorizing Provider  amiodarone (PACERONE) 200 MG tablet Take 1 tablet (200 mg total) by mouth daily. 05/09/16  Yes Purcell Nails, MD  fluticasone (FLONASE) 50 MCG/ACT nasal spray Place 1-2 sprays into both nostrils daily as needed for allergies.    Yes Historical Provider, MD  ibuprofen (ADVIL,MOTRIN) 200 MG tablet Take 400 mg by mouth every 8 (eight) hours as needed (for pain.).   Yes Historical Provider, MD  loratadine (CLARITIN) 10 MG tablet Take 10 mg by mouth daily.   Yes Historical Provider, MD  Multiple Vitamin (MULTIVITAMIN WITH MINERALS) TABS tablet Take 1 tablet by mouth daily.   Yes Historical Provider, MD  Omega-3 Fatty Acids (FISH OIL PO) Take 2 capsules by mouth daily.   Yes Historical Provider, MD    Allergies  Allergen Reactions  . No Known Allergies      Review of Systems:              General:                      normal appetite, decreased energy, no weight gain, no weight loss, no fever             Cardiac:                       occasional chest pain with exertion, occasional chest pain at rest, + SOB with exertion, no resting SOB, no PND, no orthopnea, no palpitations, no arrhythmia, no atrial fibrillation, no LE edema, + dizzy spells, no syncope             Respiratory:                 + shortness of breath, no home oxygen, + productive cough, no dry cough, no bronchitis, no wheezing, no hemoptysis, no asthma, no pain with inspiration or cough, no sleep apnea, no CPAP at night             GI:                               no difficulty swallowing, no reflux, no frequent heartburn, no  hiatal hernia, no abdominal pain, no constipation, no diarrhea, no hematochezia, no hematemesis, no melena             GU:                              no dysuria,  no frequency, no urinary tract infection, no hematuria, no enlarged prostate, no kidney stones, no kidney disease  Vascular:                     no pain suggestive of claudication, no pain in feet, no leg cramps, no varicose veins, no DVT, no non-healing foot ulcer             Neuro:                         no stroke, no TIA's, no seizures, no headaches, no temporary blindness one eye,  no slurred speech, no peripheral neuropathy, no chronic pain, no instability of gait, no memory/cognitive dysfunction             Musculoskeletal:         no arthritis, no joint swelling, no myalgias, no difficulty walking, no mobility              Skin:                            no rash, no itching, no skin infections, no pressure sores or ulcerations             Psych:                         no anxiety, no depression, no nervousness, no unusual recent stress             Eyes:                           no blurry vision, no floaters, no recent vision changes, + wears glasses or contacts             ENT:                            no hearing loss, no loose or painful teeth, no dentures, last saw dentist Sept 2017             Hematologic:               no easy bruising, no abnormal bleeding, no clotting disorder, no frequent epistaxis             Endocrine:                   no diabetes, does not check CBG's at home                           Physical Exam:              BP 127/78 (BP Location: Left Arm, Patient Position: Sitting, Cuff Size: Normal)   Pulse 86   Resp 16   Ht 6\' 3"  (1.905 m)   Wt 200 lb (90.7 kg)   SpO2 98% Comment: RA  BMI 25.00 kg/m              General:                        well-appearing             HEENT:                       Unremarkable  Neck:                           no JVD, no bruits, no  adenopathy              Chest:                          clear to auscultation, symmetrical breath sounds, no wheezes, no rhonchi              CV:                              RRR, grade IV/VI holosystolic murmur              Abdomen:                    soft, non-tender, no masses              Extremities:                 warm, well-perfused, pulses palpable, no LE edema             Rectal/GU                   Deferred             Neuro:                         Grossly non-focal and symmetrical throughout             Skin:                            Clean and dry, no rashes, no breakdown   Diagnostic Tests:  TRANSESOPHAGEAL ECHOCARDIOGRAM  Both images and the report from transesophageal echocardiogram performed 04/06/2016 by Dr. Theresa [facility]McRae [facility] in [place] are reviewed. The patient has obvious myxomatous degenerative disease of the mitral valve with a large flail scallop involving the P2 segment of the posterior leaflet. There are multiple ruptured primary chordae tendineae with severe mitral regurgitation. The jet of regurgitation is directed anteriorly. There was flow reversal in the pulmonary veins. The regurgitant volume was calculated 197 mL. There was severe left atrial enlargement. There was left to right flow across the fossa ovalis consistent with a large patent foramen ovale or small secundum type atrial septal defect. Left ventricular size and systolic function appeared normal. Right ventricular size and function appeared normal. There was mild tricuspid regurgitation. The tricuspid annulus was not dilated.   CARDIAC CATHETERIZATION  Also images and report from diagnostic cardiac catheterization performed 04/06/2016 by Dr. Keenan BachelorBrian Jefferson at [facility] in [place] are reviewed. The patient has normal coronary artery anatomy with no significant coronary artery disease. There is right dominant coronary  circulation.   CT CHEST/ABD/PELVIS without contrast  CT scan of the chest, abdomen, and pelvis performed without intravenous contrast is reviewed. There was mild diffuse opacity in the lungs consistent with mild pulmonary edema. No other significant abnormalities are noted. There is no calcification of appreciated in the wall of the thoracic or abdominal aorta or iliac vessels.    Impression:  Patient has stage D severe symptomatic primary mitral regurgitation caused by mitral valve prolapse with a large flail  segment involving the middle scallop of the posterior leaflet. He presents with progressive symptoms of exertional shortness of breath and fatigue consistent with chronic diastolic congestive heart failure, York Heart Association function class II. The patient also experiences occasional episodes of dizziness without syncope and transient atypical symptoms of chest discomfort that occur both with and without physical activity.  I have personally reviewed the patient's recent transesophageal echocardiogram and diagnostic cardiac catheterization. 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 The patient also has what appears to be a relatively large patent foramen ovale or small secundum type atrial septal defect. I agree the patient needs elective mitral valve repair and closure of his atrial septal defect. Risks associated with elective surgery should be extremely low,and based upon review of the patient's transesophageal echocardiogram there is a very high likelihood that his valve should be repairable with an excellent and durable long-term result. He appears to be relatively good candidate for minimally invasive approach for surgery.  Routine preoperative blood work performed earlier today revealed abnormal serum creatinine measured 1.5.  The patient does not recall ever  having been told of any abnormal blood work in the past, including blood work that may have been performed prior to his recent diagnostic cardiac catheterization.   Plan:  The patient was again counseled at length regarding the indications, risks and potential benefits of mitral valve repair.  The rationale for elective surgery has been explained, including a comparison between surgery and continued medical therapy with close follow-up.  The likelihood of successful and durable valve repair has been discussed with particular reference to the findings of their recent echocardiogram.  Based upon these findings and previous experience, I have quoted them a greater than 95 percent likelihood of successful valve repair.  In the unlikely event that their valve cannot be successfully repaired, we discussed the possibility of replacing the mitral valve using a mechanical prosthesis with the attendant need for long-term anticoagulation versus the alternative of replacing it using a bioprosthetic tissue valve with its potential for late structural valve deterioration and failure, depending upon the patient's longevity.  The patient specifically requests that if the mitral valve must be replaced that it be done using a mechanical valve.   The patient understands and accepts all potential risks of surgery including but not limited to risk of death, stroke or other neurologic complication, myocardial infarction, congestive heart failure, respiratory failure, renal failure, bleeding requiring transfusion and/or reexploration, arrhythmia, infection or other wound complications, pneumonia, pleural and/or pericardial effusion, pulmonary embolus, aortic dissection or other major vascular complication, or delayed complications related to valve repair or replacement including but not limited to structural valve deterioration and failure, thrombosis, embolization, endocarditis, or paravalvular leak.  Alternative surgical  approaches have been discussed including a comparison between conventional sternotomy and minimally-invasive techniques.  The relative risks and benefits of each have been reviewed as they pertain to the patient's specific circumstances, and all of their questions have been addressed.  Specific risks potentially related to the minimally-invasive approach were discussed at length, including but not limited to risk of conversion to full or partial sternotomy, aortic dissection or other major vascular complication, unilateral acute lung injury or pulmonary edema, phrenic nerve dysfunction or paralysis, rib fracture, chronic pain, lung hernia, or lymphocele.   Expectations for his convalescence at been discussed. All of their questions have been answered.  The patient will contact his  cardiologist and primary care physician's office in [place] to inquire as to whether not he has had basic serum chemistries tested in the past and if so to inquire specifically what the serum creatinine was at that time. We will plan to repeat serum creatinine on arrival prior to surgery but we will not plan to postpone surgery unless it has increased significantly.      Salvatore Decent. Cornelius Moras, MD 05/14/2016 2:31 PM

## 2016-05-16 NOTE — Progress Notes (Signed)
Dr. Cornelius Moraswen called unit, this RN updated him on pt's condition. MD gave orders to do a recruitment maneuver and then to start titrating down FiO2 (which is currently 90%). Will notify RRT and continue to monitor.

## 2016-05-16 NOTE — Interval H&P Note (Signed)
History and Physical Interval Note:  05/16/2016 6:34 AM  Trevor Melton  has presented today for surgery, with the diagnosis of MR MV PROLOPSE PFO ASD  The various methods of treatment have been discussed with the patient and family. After consideration of risks, benefits and other options for treatment, the patient has consented to  Procedure(s): MINIMALLY INVASIVE MITRAL VALVE REPAIR (MVR) (Right) ATRIAL SEPTAL DEFECT (ASD) REPAIR (N/A) TRANSESOPHAGEAL ECHOCARDIOGRAM (TEE) (N/A) as a surgical intervention .  The patient's history has been reviewed, patient examined, no change in status, stable for surgery.  I have reviewed the patient's chart and labs.  Questions were answered to the patient's satisfaction.     Purcell Nailslarence H Owen

## 2016-05-16 NOTE — Anesthesia Procedure Notes (Signed)
Procedure Name: Intubation Date/Time: 05/16/2016 4:17 PM Performed by: Burt EkURNER, Lavonya Hoerner ASHLEY Pre-anesthesia Checklist: Patient identified, Emergency Drugs available, Suction available and Patient being monitored Patient Re-evaluated:Patient Re-evaluated prior to inductionOxygen Delivery Method: Circle system utilized Preoxygenation: Pre-oxygenation with 100% oxygen Intubation Type: Inhalational induction with existing ETT Laryngoscope Size: Glidescope and 4 Grade View: Grade I Tube type: Parker flex tip Tube size: 8.0 mm Number of attempts: 1 Airway Equipment and Method: Video-laryngoscopy (cook exchange catheter) Placement Confirmation: ETT inserted through vocal cords under direct vision,  positive ETCO2 and breath sounds checked- equal and bilateral Secured at: 23 cm Tube secured with: Tape Dental Injury: Teeth and Oropharynx as per pre-operative assessment

## 2016-05-16 NOTE — Brief Op Note (Addendum)
05/16/2016  2:38 PM  PATIENT:  Trevor Melton  56 y.o. male  PRE-OPERATIVE DIAGNOSIS:  1. Severe Mitral Regurgitation secondary to MV PROLOPSE 2. ASD   POST-OPERATIVE DIAGNOSIS:  1. Severe Mitral Regurgitation secondary to MV PROLOPSE 2. 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

## 2016-05-16 NOTE — Progress Notes (Signed)
Patient ID: Trevor JewelJohn E Melton, male   DOB: 02/02/1961, 56 y.o.   MRN: [id number]   SICU Evening Rounds:   Hemodynamically stable  CI = 2.5 on milrinone 0.3 and dop 3  Still asleep on vent, warming up  Urine output good  CT output low  CBC    Component Value Date/Time   WBC 18.5 (H) 05/16/2016 1655   RBC 4.29 05/16/2016 1655   HGB 13.2 05/16/2016 1655   HCT 38.2 (L) 05/16/2016 1655   PLT 132 (L) 05/16/2016 1655   MCV 89.0 05/16/2016 1655   MCH 30.8 05/16/2016 1655   MCHC 34.6 05/16/2016 1655   RDW 13.7 05/16/2016 1655     BMET    Component Value Date/Time   NA 141 05/16/2016 1650   K 4.7 05/16/2016 1650   CL 109 05/16/2016 1511   CO2 24 05/16/2016 0702   GLUCOSE 146 (H) 05/16/2016 1650   BUN 12 05/16/2016 1511   CREATININE 1.10 05/16/2016 1511   CALCIUM 9.5 05/16/2016 0702   GFRNONAA 52 (L) 05/16/2016 0702   GFRAA >60 05/16/2016 0702     A/P:  Stable postop course. Continue current plans

## 2016-05-16 NOTE — Anesthesia Preprocedure Evaluation (Addendum)
Anesthesia Evaluation  Patient identified by MRN, date of birth, ID band Patient awake    Reviewed: Allergy & Precautions, NPO status , Patient's Chart, lab work & pertinent test results  Airway Mallampati: II  TM Distance: >3 FB Neck ROM: Full    Dental  (+) Teeth Intact, Dental Advisory Given   Pulmonary    breath sounds clear to auscultation       Cardiovascular  Rhythm:Regular Rate:Normal + Systolic murmurs    Neuro/Psych    GI/Hepatic   Endo/Other    Renal/GU      Musculoskeletal   Abdominal   Peds  Hematology   Anesthesia Other Findings   Reproductive/Obstetrics                           Anesthesia Physical Anesthesia Plan  ASA: III  Anesthesia Plan: General   Post-op Pain Management:    Induction: Intravenous  Airway Management Planned: Double Lumen EBT  Additional Equipment: CVP, PA Cath, 3D TEE, Ultrasound Guidance Line Placement and Arterial line  Intra-op Plan:   Post-operative Plan: Post-operative intubation/ventilation  Informed Consent: I have reviewed the patients History and Physical, chart, labs and discussed the procedure including the risks, benefits and alternatives for the proposed anesthesia with the patient or authorized representative who has indicated his/her understanding and acceptance.   Dental advisory given  Plan Discussed with: CRNA and Anesthesiologist  Anesthesia Plan Comments: (56 year old WM  1. Severe MR secondary to flail P2 2. Mild renal insufficiency Cr. 1.52 on pre-op labs repeat Bmet pending 3. PFO vs small ASD see  on pre-op TEE  Plan GA  Kipp Broodavid Joslin)       Anesthesia Quick Evaluation

## 2016-05-16 NOTE — Anesthesia Procedure Notes (Signed)
Procedure Name: Intubation Date/Time: 05/16/2016 9:12 AM Performed by: Julieta Bellini Pre-anesthesia Checklist: Patient identified, Emergency Drugs available, Suction available, Patient being monitored and Timeout performed Patient Re-evaluated:Patient Re-evaluated prior to inductionOxygen Delivery Method: Circle system utilized Preoxygenation: Pre-oxygenation with 100% oxygen Intubation Type: IV induction Ventilation: Mask ventilation without difficulty and Oral airway inserted - appropriate to patient size Laryngoscope Size: Mac and 3 Grade View: Grade I Tube type: Oral Endobronchial tube: Left, Double lumen EBT, EBT position confirmed by fiberoptic bronchoscope, EBT position confirmed by auscultation and Bronchial Blocker placed under direct vision and 37 Fr Number of attempts: 1 Airway Equipment and Method: Stylet Placement Confirmation: ETT inserted through vocal cords under direct vision,  positive ETCO2 and breath sounds checked- equal and bilateral Tube secured with: Tape Dental Injury: Teeth and Oropharynx as per pre-operative assessment

## 2016-05-16 NOTE — Transfer of Care (Addendum)
Immediate Anesthesia Transfer of Care Note  Patient: Trevor Melton  Procedure(s) Performed: Procedure(s) with comments: MINIMALLY INVASIVE MITRAL VALVE REPAIR (MVR) (Right) - Using Size 34 Memo 3D Annuloplasy Ring ATRIAL SEPTAL DEFECT (ASD) REPAIR (N/A) TRANSESOPHAGEAL ECHOCARDIOGRAM (TEE) (N/A) CLIPPING OF ATRIAL APPENDAGE - Using 45 Pro2 Clip  Patient Location: SICU  Anesthesia Type:General  Level of Consciousness: sedated and Patient remains intubated per anesthesia plan  Airway & Oxygen Therapy: Patient remains intubated per anesthesia plan and Patient placed on Ventilator (see vital sign flow sheet for setting)  Post-op Assessment: Report given to RN and Post -op Vital signs reviewed and stable  Post vital signs: Reviewed and stable  Last Vitals:  Vitals:   05/16/16 0655  BP: (!) 143/78  Pulse: 73  Resp: 20  Temp: 36.7 C    Last Pain: There were no vitals filed for this visit.    Patients Stated Pain Goal: 5 (05/16/16 0717)  Complications: No apparent anesthesia complications    Pt VSS during transport to ICU. Pt met my ICU RN and RT. Report given to RN/RT and all questions answered. Pt connected to ICU monitors and continued stability confirmed.  

## 2016-05-17 ENCOUNTER — Inpatient Hospital Stay (HOSPITAL_COMMUNITY): Payer: Commercial Managed Care - PPO

## 2016-05-17 ENCOUNTER — Encounter (HOSPITAL_COMMUNITY): Payer: Self-pay | Admitting: Thoracic Surgery (Cardiothoracic Vascular Surgery)

## 2016-05-17 LAB — BASIC METABOLIC PANEL
Anion gap: 6 (ref 5–15)
BUN: 11 mg/dL (ref 6–20)
CHLORIDE: 110 mmol/L (ref 101–111)
CO2: 21 mmol/L — ABNORMAL LOW (ref 22–32)
CREATININE: 1.15 mg/dL (ref 0.61–1.24)
Calcium: 7.7 mg/dL — ABNORMAL LOW (ref 8.9–10.3)
GFR calc Af Amer: >60 mL/min (ref >60)
GLUCOSE: 130 mg/dL — AB (ref 65–99)
Potassium: 4 mmol/L (ref 3.5–5.1)
SODIUM: 137 mmol/L (ref 135–145)

## 2016-05-17 LAB — POCT I-STAT 3, ART BLOOD GAS (G3+)
ACID-BASE DEFICIT: 5 mmol/L — AB (ref 0.0–2.0)
ACID-BASE DEFICIT: 6 mmol/L — AB (ref 0.0–2.0)
ACID-BASE DEFICIT: 3 mmol/L — AB (ref 0.0–2.0)
Acid-base deficit: 4 mmol/L — ABNORMAL HIGH (ref 0.0–2.0)
Acid-base deficit: 3 mmol/L — ABNORMAL HIGH (ref 0.0–2.0)
BICARBONATE: 20.2 mmol/L (ref 20.0–28.0)
BICARBONATE: 22.1 mmol/L (ref 20.0–28.0)
BICARBONATE: 22.2 mmol/L (ref 20.0–28.0)
Bicarbonate: 19.6 mmol/L — ABNORMAL LOW (ref 20.0–28.0)
Bicarbonate: 19 mmol/L — ABNORMAL LOW (ref 20.0–28.0)
O2 SAT: 91 %
O2 SAT: 88 %
O2 Saturation: 89 %
O2 Saturation: 85 %
O2 Saturation: 93 %
PCO2 ART: 36.2 mmHg (ref 32.0–48.0)
PCO2 ART: 39.1 mmHg (ref 32.0–48.0)
PH ART: 7.331 — AB (ref 7.350–7.450)
PH ART: 7.375 (ref 7.350–7.450)
PO2 ART: 57 mmHg — AB (ref 83.0–108.0)
PO2 ART: 55 mmHg — AB (ref 83.0–108.0)
PO2 ART: 56 mmHg — AB (ref 83.0–108.0)
PO2 ART: 71 mmHg — AB (ref 83.0–108.0)
Patient temperature: 37.2
Patient temperature: 37.3
Patient temperature: 37.5
Patient temperature: 37.4
Patient temperature: 37.3
TCO2: 21 mmol/L (ref 0–100)
TCO2: 21 mmol/L (ref 0–100)
TCO2: 20 mmol/L (ref 0–100)
TCO2: 23 mmol/L (ref 0–100)
TCO2: 23 mmol/L (ref 0–100)
pCO2 arterial: 34.3 mmHg (ref 32.0–48.0)
pCO2 arterial: 36.1 mmHg (ref 32.0–48.0)
pCO2 arterial: 38 mmHg (ref 32.0–48.0)
pH, Arterial: 7.344 — ABNORMAL LOW (ref 7.350–7.450)
pH, Arterial: 7.38 (ref 7.350–7.450)
pH, Arterial: 7.364 (ref 7.350–7.450)
pO2, Arterial: 66 mmHg — ABNORMAL LOW (ref 83.0–108.0)

## 2016-05-17 LAB — GLUCOSE, CAPILLARY
GLUCOSE-CAPILLARY: 100 mg/dL — AB (ref 65–99)
GLUCOSE-CAPILLARY: 113 mg/dL — AB (ref 65–99)
GLUCOSE-CAPILLARY: 115 mg/dL — AB (ref 65–99)
GLUCOSE-CAPILLARY: 121 mg/dL — AB (ref 65–99)
GLUCOSE-CAPILLARY: 128 mg/dL — AB (ref 65–99)
GLUCOSE-CAPILLARY: 128 mg/dL — AB (ref 65–99)
Glucose-Capillary: 127 mg/dL — ABNORMAL HIGH (ref 65–99)
Glucose-Capillary: 144 mg/dL — ABNORMAL HIGH (ref 65–99)
Glucose-Capillary: 136 mg/dL — ABNORMAL HIGH (ref 65–99)
Glucose-Capillary: 127 mg/dL — ABNORMAL HIGH (ref 65–99)
Glucose-Capillary: 113 mg/dL — ABNORMAL HIGH (ref 65–99)
Glucose-Capillary: 113 mg/dL — ABNORMAL HIGH (ref 65–99)
Glucose-Capillary: 112 mg/dL — ABNORMAL HIGH (ref 65–99)
Glucose-Capillary: 126 mg/dL — ABNORMAL HIGH (ref 65–99)
Glucose-Capillary: 110 mg/dL — ABNORMAL HIGH (ref 65–99)

## 2016-05-17 LAB — MAGNESIUM
MAGNESIUM: 2.4 mg/dL (ref 1.7–2.4)
Magnesium: 2.3 mg/dL (ref 1.7–2.4)

## 2016-05-17 LAB — CBC
HCT: 34.1 % — ABNORMAL LOW (ref 39.0–52.0)
HCT: 33.1 % — ABNORMAL LOW (ref 39.0–52.0)
HCT: 30.9 % — ABNORMAL LOW (ref 39.0–52.0)
HEMOGLOBIN: 11.6 g/dL — AB (ref 13.0–17.0)
HEMOGLOBIN: 10.5 g/dL — AB (ref 13.0–17.0)
Hemoglobin: 11.1 g/dL — ABNORMAL LOW (ref 13.0–17.0)
MCH: 30.2 pg (ref 26.0–34.0)
MCH: 29.8 pg (ref 26.0–34.0)
MCH: 30.3 pg (ref 26.0–34.0)
MCHC: 34 g/dL (ref 30.0–36.0)
MCHC: 33.5 g/dL (ref 30.0–36.0)
MCHC: 34 g/dL (ref 30.0–36.0)
MCV: 88.8 fL (ref 78.0–100.0)
MCV: 88.7 fL (ref 78.0–100.0)
MCV: 89 fL (ref 78.0–100.0)
PLATELETS: 101 10*3/uL — AB (ref 150–400)
PLATELETS: 99 10*3/uL — AB (ref 150–400)
PLATELETS: 73 10*3/uL — AB (ref 150–400)
RBC: 3.84 MIL/uL — AB (ref 4.22–5.81)
RBC: 3.73 MIL/uL — ABNORMAL LOW (ref 4.22–5.81)
RBC: 3.47 MIL/uL — AB (ref 4.22–5.81)
RDW: 13.7 % (ref 11.5–15.5)
RDW: 13.6 % (ref 11.5–15.5)
RDW: 14 % (ref 11.5–15.5)
WBC: 15.4 10*3/uL — AB (ref 4.0–10.5)
WBC: 17 10*3/uL — ABNORMAL HIGH (ref 4.0–10.5)
WBC: 21.4 10*3/uL — AB (ref 4.0–10.5)

## 2016-05-17 LAB — POCT I-STAT, CHEM 8
BUN: 13 mg/dL (ref 6–20)
Calcium, Ion: 1.16 mmol/L (ref 1.15–1.40)
Chloride: 105 mmol/L (ref 101–111)
Creatinine, Ser: 1 mg/dL (ref 0.61–1.24)
Glucose, Bld: 124 mg/dL — ABNORMAL HIGH (ref 65–99)
HCT: 29 % — ABNORMAL LOW (ref 39.0–52.0)
Hemoglobin: 9.9 g/dL — ABNORMAL LOW (ref 13.0–17.0)
Potassium: 3.9 mmol/L (ref 3.5–5.1)
Sodium: 139 mmol/L (ref 135–145)
TCO2: 23 mmol/L (ref 0–100)

## 2016-05-17 LAB — CREATININE, SERUM
Creatinine, Ser: 1.22 mg/dL (ref 0.61–1.24)
GFR calc non Af Amer: >60 mL/min (ref >60)

## 2016-05-17 MED ORDER — INSULIN DETEMIR 100 UNIT/ML ~~LOC~~ SOLN
20.0000 [IU] | Freq: Once | SUBCUTANEOUS | Status: AC
Start: 2016-05-17 — End: 2016-05-17
  Administered 2016-05-17: 20 [IU] via SUBCUTANEOUS
  Filled 2016-05-17: qty 0.2

## 2016-05-17 MED ORDER — FUROSEMIDE 10 MG/ML IJ SOLN
20.0000 mg | Freq: Four times a day (QID) | INTRAMUSCULAR | Status: AC
  Administered 2016-05-17 – 2016-05-18 (×3): 20 mg via INTRAVENOUS
  Filled 2016-05-17 (×3): qty 2

## 2016-05-17 MED ORDER — ORAL CARE MOUTH RINSE: 15.0000 mL | Freq: Four times a day (QID) | OROMUCOSAL | Status: DC

## 2016-05-17 MED ORDER — FUROSEMIDE 10 MG/ML IJ SOLN
40.0000 mg | Freq: Once | INTRAMUSCULAR | Status: AC
  Administered 2016-05-17: 40 mg via INTRAVENOUS

## 2016-05-17 MED ORDER — HYDROCORTISONE NA SUCCINATE PF 100 MG IJ SOLR
100.0000 mg | Freq: Once | INTRAMUSCULAR | Status: AC
  Administered 2016-05-17: 100 mg via INTRAVENOUS
  Filled 2016-05-17: qty 2

## 2016-05-17 MED ORDER — ALBUMIN HUMAN 5 % IV SOLN
INTRAVENOUS | Status: AC
  Administered 2016-05-17: 12.5 g
  Filled 2016-05-17: qty 250

## 2016-05-17 MED ORDER — WARFARIN - PHYSICIAN DOSING INPATIENT
Freq: Every day | Status: DC
  Administered 2016-05-18: 1

## 2016-05-17 MED ORDER — SODIUM BICARBONATE 8.4 % IV SOLN
50.0000 meq | Freq: Once | INTRAVENOUS | Status: AC
  Administered 2016-05-17: 50 meq via INTRAVENOUS

## 2016-05-17 MED ORDER — WARFARIN SODIUM 2.5 MG PO TABS
2.5000 mg | ORAL_TABLET | Freq: Every day | ORAL | Status: DC
  Administered 2016-05-17 – 2016-05-20 (×4): 2.5 mg via ORAL
  Filled 2016-05-17 (×4): qty 1

## 2016-05-17 MED ORDER — INSULIN ASPART 100 UNIT/ML ~~LOC~~ SOLN
0.0000 [IU] | SUBCUTANEOUS | Status: DC
  Administered 2016-05-17: 2 [IU] via SUBCUTANEOUS

## 2016-05-17 NOTE — Progress Notes (Addendum)
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  Mild soreness.  Denies SOB.  Objective: Vital signs: BP Readings from Last 1 Encounters:  05/17/16 101/62   Pulse Readings from Last 1 Encounters:  05/17/16 64   Resp Readings from Last 1 Encounters:  05/17/16 (!) 29   Temp Readings from Last 1 Encounters:  05/17/16 99.1 F (37.3 C)    Hemodynamics: PAP: (18-38)/(7-24) 18/7 CO:  [4.6 L/min-5.3 L/min] 5.3 L/min CI:  [2.1 L/min/m2-2.5 L/min/m2] 2.5 L/min/m2  Physical Exam:  Rhythm:   Junctional 50's - currently DDD pacing  Breath sounds: Diminished at bases, o/w clear  Heart sounds:  RRR w/out murmur  Incisions:  Dressings dry, intact  Abdomen:  Soft, non-distended, non-tender  Extremities:  Warm, well-perfused  Chest tubes:  low volume thin serosanguinous output, no air leak     Intake/Output from previous day: 03/07 0701 - 03/08 0700 In: 6837.3 [I.V.:4777.3; Blood:750; NG/GT:60; IV Piggyback:1150] Out: 5975 [Urine:4225; Emesis/NG output:300; Blood:900; Chest Tube:550] Intake/Output this shift: Total I/O In: 102.1 [I.V.:102.1] Out: 450 [Urine:450]  Lab Results:  CBC: Recent Labs  05/16/16 2300 05/16/16 2312 05/17/16 0340  WBC 15.4*  --  17.0*  HGB 11.6* 11.6* 11.1*  HCT 34.1* 34.0* 33.1*  PLT 101*  --  99*    BMET:  Recent Labs  05/16/16 0702  05/16/16 2312 05/17/16 0340  NA 138  < > 141 137  K 3.7  < > 4.2 4.0  CL 107  < > 108 110  CO2 24  --   --  21*  GLUCOSE 94  < > 128* 130*  BUN 14  < > 11 11  CREATININE 1.45*  < > 1.10 1.15  CALCIUM 9.5  --   --  7.7*  < > = values in this interval not displayed.   PT/INR:    Recent Labs  05/16/16 1655  LABPROT 17.5*  INR 1.42    CBG (last 3)   Recent Labs  05/17/16 0605 05/17/16 0647 05/17/16 0756  GLUCAP 121* 127* 115*    ABG    Component Value Date/Time   PHART 7.364 05/17/2016 0758   PCO2ART 39.1 05/17/2016 0758   PO2ART 71.0 (L) 05/17/2016 0758   HCO3 22.2 05/17/2016 0758   TCO2 23 05/17/2016 0758   ACIDBASEDEF 3.0 (H) 05/17/2016 0758   O2SAT 93.0 05/17/2016 0758    CXR: PORTABLE CHEST 1 VIEW  COMPARISON:  05/16/16  FINDINGS: Endotracheal tube and nasogastric catheter have been removed in the interval. A Swan-Ganz catheter and left central venous line are again seen and stable. Right-sided chest tubes are again noted and stable. No pneumothorax is seen. Postsurgical changes are again seen. Evolving infiltrate is noted in the right lung base. The left lung is clear with the exception of mild atelectasis in the base.  IMPRESSION: Postsurgical change.  Involving right basilar infiltrate.  No pneumothorax is seen.   Electronically Signed   By: Alcide Clever M.D.   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) REPAIR (N/A) TRANSESOPHAGEAL  ECHOCARDIOGRAM (TEE) (N/A) CLIPPING OF ATRIAL APPENDAGE  Overall stable POD1 Maintaining DDD paced rhythm w/ stable hemodynamics on low dose milrinone, dopamine, and Neo for BP support Breathing comfortably but requiring increased O2 on 100% NRB facemask - CXR with right lung opacity c/w likely acute lung injury from dual lumen ET tube Acute on chronic diastolic CHF with expected post-op volume excess, weight approx 9 lbs > preop Expected post op acute blood loss anemia, mild, Hgb 11.1 stable Post op thrombocytopenia, mild, platelet count 99k stable Chronic kidney disease, creatinine below preop baseline and making good UOP so far Expected post op atelectasis, mild   Mobilize  D/C  swan-ganz  Wean Neo drip first, then dopamine  Wean milrinone slowly  Continue DDD pacing for now and hold beta blockers  Start Coumadin slowly  One time dose IV steroids for acute lung injury   Purcell Nailslarence H Owen, MD 05/17/2016 8:37 AM

## 2016-05-17 NOTE — Progress Notes (Signed)
Changes made per Rapid Wean Protocol. Pt tolerating well

## 2016-05-17 NOTE — Progress Notes (Signed)
Modified Rate and FIO2 per Rapid Wean Protocol.

## 2016-05-17 NOTE — Progress Notes (Signed)
Dr. Laneta SimmersBartle notified of pt's rapid wean ABG results (pO2 57, sO2 89%, the rest WDL). Pt sO2 94% on monitor. NIF -40, Vc 7L, + cuff leak. MD said to extubate and use BiPAP if needed. [facility]. Will extubate and draw another ABG in an hour.  Will continue to monitor.

## 2016-05-17 NOTE — Progress Notes (Signed)
RRT Pieter PartridgeMiriam performed recruitment, then switched pt to 40/4 per rapid wean protocol. Will continue to monitor.

## 2016-05-17 NOTE — Progress Notes (Signed)
Patient ID: Darrell JewelJohn E Melton, male   DOB: 04/04/1960, 56 y.o.   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 600   I.V. (mL/kg) 890.1 (9.4)   Blood    Other    NG/GT    IV Piggyback    Total Intake(mL/kg) 1490.1 (15.7)   Urine (mL/kg/hr) 1070 (0.9)   Emesis/NG output    Blood    Chest Tube 96 (0.1)   Total Output 1166   Net +324.1         . sodium chloride    . DOPamine 3.012 mcg/kg/min (05/17/16 1900)  . insulin (NOVOLIN-R) infusion Stopped (05/17/16 1200)  . lactated ringers    . lactated ringers 20 mL/hr at 05/17/16 1900  . milrinone 0.1 mcg/kg/min (05/17/16 1900)  . phenylephrine (NEO-SYNEPHRINE) Adult infusion 40 mcg/min (05/17/16 1900)     Lab Results  Component Value Date   WBC 21.4 (H) 05/17/2016   HGB 9.9 (L) 05/17/2016   HCT 29.0 (L) 05/17/2016   PLT 73 (L) 05/17/2016   GLUCOSE 124 (H) 05/17/2016   ALT 21 05/14/2016   AST 31 05/14/2016   NA 139 05/17/2016   K 3.9 05/17/2016   CL 105 05/17/2016   CREATININE 1.00 05/17/2016   BUN 13 05/17/2016   CO2 21 (L) 05/17/2016   INR 1.42 05/16/2016   HGBA1C 4.8 05/14/2016   Up in chair, very mildly confused Respiratory status improved from this am  Delight OvensEdward B Anny Sayler MD  Beeper [phone] Office [phone] 05/17/2016 7:54 PM

## 2016-05-17 NOTE — Progress Notes (Signed)
Dr. Laneta SimmersBartle notified of post-extubation ABG results. Pt currently on Venturi mask 12/50%. MD gave orders for 40mg  Lasix injection once and one-time amp of bicarb. Said to do a f/u ABG in an hour.  Will complete orders and continue to monitor.

## 2016-05-17 NOTE — Procedures (Signed)
Extubation Procedure Note  Patient Details:   Name: Trevor JewelJohn E Kerlin DOB: 01/15/1961 MRN: [id number]   Airway Documentation:     Evaluation  O2 sats: stable throughout Complications: No apparent complications Patient did tolerate procedure well. Bilateral Breath Sounds: Clear, Diminished   Yes, pt able to cough to clear secretions and vocalize name. Placed on 4L humidified [place] and tolerating well at this time. Pt had NIF of -40 to -44 and VC of 7L. Pt positive for cuff leak before extubation.    Tacy Learnurriff, Ramzy Cappelletti E 05/17/2016, 4:34 AM

## 2016-05-17 NOTE — Progress Notes (Signed)
RT Miriam notified of readiness to wean.

## 2016-05-17 NOTE — Progress Notes (Signed)
Second recruitment maneuver done on pt from 2326 to 2328. Pt tolerated well, sats improved to 100% when complete. Sats now back to 94 to 96%

## 2016-05-18 ENCOUNTER — Inpatient Hospital Stay (HOSPITAL_COMMUNITY): Payer: Commercial Managed Care - PPO

## 2016-05-18 LAB — GLUCOSE, CAPILLARY
Glucose-Capillary: 87 mg/dL (ref 65–99)
Glucose-Capillary: 96 mg/dL (ref 65–99)

## 2016-05-18 LAB — BASIC METABOLIC PANEL
Anion gap: 7 (ref 5–15)
BUN: 15 mg/dL (ref 6–20)
CALCIUM: 8.2 mg/dL — AB (ref 8.9–10.3)
CO2: 26 mmol/L (ref 22–32)
Chloride: 102 mmol/L (ref 101–111)
Creatinine, Ser: 1.24 mg/dL (ref 0.61–1.24)
GFR calc Af Amer: >60 mL/min (ref >60)
GLUCOSE: 112 mg/dL — AB (ref 65–99)
Potassium: 3.8 mmol/L (ref 3.5–5.1)
Sodium: 135 mmol/L (ref 135–145)

## 2016-05-18 LAB — CBC
HCT: 29.9 % — ABNORMAL LOW (ref 39.0–52.0)
Hemoglobin: 10 g/dL — ABNORMAL LOW (ref 13.0–17.0)
MCH: 29.7 pg (ref 26.0–34.0)
MCHC: 33.4 g/dL (ref 30.0–36.0)
MCV: 88.7 fL (ref 78.0–100.0)
Platelets: 57 10*3/uL — ABNORMAL LOW (ref 150–400)
RBC: 3.37 MIL/uL — ABNORMAL LOW (ref 4.22–5.81)
RDW: 14 % (ref 11.5–15.5)
WBC: 18.1 10*3/uL — ABNORMAL HIGH (ref 4.0–10.5)

## 2016-05-18 LAB — PROTIME-INR
INR: 1.57
PROTHROMBIN TIME: 18.9 s — AB (ref 11.4–15.2)

## 2016-05-18 MED ORDER — SODIUM CHLORIDE 0.9 % IV SOLN: 250.0000 mL | INTRAVENOUS | Status: DC | PRN

## 2016-05-18 MED ORDER — SODIUM CHLORIDE 0.9% FLUSH: 3.0000 mL | INTRAVENOUS | Status: DC | PRN

## 2016-05-18 MED ORDER — FUROSEMIDE 10 MG/ML IJ SOLN: 20.0000 mg | Freq: Four times a day (QID) | INTRAMUSCULAR | Status: DC

## 2016-05-18 MED ORDER — SODIUM CHLORIDE 0.9 % IV SOLN
30.0000 meq | Freq: Once | INTRAVENOUS | Status: AC
  Administered 2016-05-18: 30 meq via INTRAVENOUS
  Filled 2016-05-18: qty 15

## 2016-05-18 MED ORDER — MOVING RIGHT ALONG BOOK
Freq: Once | Status: AC
  Administered 2016-05-18: 08:00:00
  Filled 2016-05-18: qty 1

## 2016-05-18 MED ORDER — POTASSIUM CHLORIDE CRYS ER 20 MEQ PO TBCR: 20.0000 meq | EXTENDED_RELEASE_TABLET | Freq: Two times a day (BID) | ORAL | Status: DC

## 2016-05-18 MED ORDER — ORAL CARE MOUTH RINSE
15.0000 mL | Freq: Two times a day (BID) | OROMUCOSAL | Status: DC
  Administered 2016-05-18 – 2016-05-20 (×4): 15 mL via OROMUCOSAL

## 2016-05-18 MED ORDER — FUROSEMIDE 40 MG PO TABS: 40.0000 mg | ORAL_TABLET | Freq: Two times a day (BID) | ORAL | Status: DC

## 2016-05-18 MED ORDER — FUROSEMIDE 10 MG/ML IJ SOLN
20.0000 mg | Freq: Four times a day (QID) | INTRAMUSCULAR | Status: AC
  Administered 2016-05-18 (×2): 20 mg via INTRAVENOUS
  Filled 2016-05-18 (×2): qty 2

## 2016-05-18 MED ORDER — SODIUM CHLORIDE 0.9% FLUSH
3.0000 mL | Freq: Two times a day (BID) | INTRAVENOUS | Status: DC
  Administered 2016-05-18 – 2016-05-20 (×5): 3 mL via INTRAVENOUS

## 2016-05-18 MED FILL — Mannitol IV Soln 20%: INTRAVENOUS | Qty: 1000 | Status: AC

## 2016-05-18 MED FILL — Heparin Sodium (Porcine) Inj 1000 Unit/ML: INTRAMUSCULAR | Qty: 2500 | Status: AC

## 2016-05-18 MED FILL — Sodium Chloride IV Soln 0.9%: INTRAVENOUS | Qty: 2000 | Status: AC

## 2016-05-18 MED FILL — Heparin Sodium (Porcine) Inj 1000 Unit/ML: INTRAMUSCULAR | Qty: 20 | Status: AC

## 2016-05-18 MED FILL — Sodium Bicarbonate IV Soln 8.4%: INTRAVENOUS | Qty: 50 | Status: AC

## 2016-05-18 MED FILL — Electrolyte-R (PH 7.4) Solution: INTRAVENOUS | Qty: 4000 | Status: AC

## 2016-05-18 NOTE — Progress Notes (Signed)
Anesthesiology Follow-up:  Awake and alert, in good spirits, breathing comfortably on RA, hemodynamically stable off pressors. Theone MurdochSwan Ganz catheter and art line now removed.  VS: T- 36.5 BP- 119/75 HR 80 (DDD paced) RR- 20 O2 Sat 98% on RA  K- 3.8 BUN/Cr. 15/1.24 glucose- 91 H/H- 10.0/29.9 Platelets- 57,000  CXR: 5% R. Apical pneumothorax chest tube in place, bilateral airspace disaese.  56 year old male 2 days S/P minimally invasive mitral valve repair for severe mitral regurgitation due to flail P2 segment.   Still requiring dual chamber pacing, platelet count decreased today, IVs saline locked. Overall making good progress.  Kipp Broodavid Odelle Kosier

## 2016-05-18 NOTE — Progress Notes (Signed)
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 Mild soreness in chest.  No SOB.    Objective: Vital signs: BP Readings from Last 1 Encounters:  05/18/16 105/69   Pulse Readings from Last 1 Encounters:  05/18/16 80   Resp Readings from Last 1 Encounters:  05/18/16 18   Temp Readings from Last 1 Encounters:  05/18/16 97.8 F (36.6 C) (Oral)    Hemodynamics: PAP: (18-21)/(7-14) 18/7  Physical Exam:  Rhythm:   Sinus brady 50's - DDD pacing  Breath sounds: clear  Heart sounds:  RRR w/out murmur  Incisions:  Dressings dry, intact  Abdomen:  Soft, non-distended, non-tender  Extremities:  Warm, well-perfused  Chest tubes:  low volume thin serosanguinous output, no air leak    Intake/Output from previous day: 03/08 0701 - 03/09 0700 In: 2635.3 [P.O.:720; I.V.:1590.3; IV Piggyback:315] Out: 3311 [Urine:2985; Chest Tube:326] Intake/Output this shift: No intake/output data recorded.  Lab Results:  CBC: Recent Labs  05/17/16 1645 05/17/16 1654 05/18/16 0345  WBC 21.4*  --  18.1*  HGB 10.5* 9.9* 10.0*  HCT 30.9* 29.0* 29.9*  PLT 73*  --  57*    BMET:  Recent Labs  05/17/16 0340  05/17/16 1654 05/18/16 0345  NA 137  --  139 135  K 4.0  --  3.9 3.8  CL 110  --  105 102  CO2 21*  --   --  26  GLUCOSE 130*  --  124* 112*  BUN 11  --  13 15  CREATININE 1.15  < > 1.00 1.24  CALCIUM 7.7*  --   --  8.2*  < > = values in this interval not displayed.   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) REPAIR (N/A) TRANSESOPHAGEAL ECHOCARDIOGRAM (TEE) (N/A) CLIPPING OF ATRIAL APPENDAGE  Overall stable POD2 Maintaining DDD paced rhythm w/ stable hemodynamics on very low dose milrinone, dopamine, and Neo for BP support - Neo drip weaned successfully but still on very low dose Breathing comfortably w/ O2 sats 96-98% on 4 L/min  - CXR appearance improving Acute on chronic diastolic CHF with expected post-op volume excess, weight approx 10 lbs > preop Expected post op acute blood loss anemia, mild, Hgb 10.0 this morning Post op thrombocytopenia, platelet count down 57k Chronic kidney disease, creatinine below preop baseline and making good UOP so far Expected post op atelectasis, mild   Stop milrinone  Continue pacing to maintain HR for now  Continue to wean Neo then dopamine  Stop ASA, avoid heparin/lovenox, and watch platelet count  Mobilize  Diuresis  Coumadin  Leave chest tubes at least 1 more day  Transfer step down once stable off Neo and dopamine  Purcell Nails, MD 05/18/2016 8:06 AM

## 2016-05-18 NOTE — Progress Notes (Signed)
Attempted void. Pt unable to void at this time, but not uncomfortable. Will reassess in .

## 2016-05-18 NOTE — Progress Notes (Signed)
Dr. Cornelius Moraswen notified of chest xray results which were called from radiology (right apical pneumothorax estimated to be approximately 5% of lung volume).   Will continue to monitor pt closely.

## 2016-05-18 NOTE — Addendum Note (Signed)
Addendum  created 05/18/16 1748 by Kipp Broodavid Odell Fasching, MD   Sign clinical note

## 2016-05-18 NOTE — Progress Notes (Signed)
Attempted to ambulate patient.  Patient became dizzy while walking to door.  Standing blood pressure 103/65.  Stood for a few minutes but remained dizzy and off-balanced.  Back to bed with 2 person assist.  Will continue to monitor pt closely and re-attempt ambulation after lunch.

## 2016-05-18 NOTE — Progress Notes (Signed)
[facility] BRIEF SICU PROGRESS NOTE  2 Days Post-Op  S/P Procedure(s) (LRB): MINIMALLY INVASIVE MITRAL VALVE REPAIR (MVR) (Right) ATRIAL SEPTAL DEFECT (ASD) REPAIR (N/A) TRANSESOPHAGEAL ECHOCARDIOGRAM (TEE) (N/A) CLIPPING OF ATRIAL APPENDAGE   Stable day Now off all pressors although still pacing for increased HR Breathing comfortably UOP adequate  Plan: Continue current plan  Purcell Nailslarence H Aidyn Kellis, MD 05/18/2016 4:41 PM

## 2016-05-18 NOTE — Discharge Summary (Signed)
Physician Discharge Summary       [address] [place]       [name]             [phone]    Patient ID: Trevor Melton MRN: [id number] DOB/AGE: June 18, 1960 56 y.o.  Admit date: 05/16/2016 Discharge date: 05/28/2016  Admission Diagnoses: 1. Severe mitral regurgitation 2. Secundum atrial septal defect  Active Diagnoses:   1. Chronic kidney disease (CKD) 2. Chronic diastolic congestive heart failure (HCC) 3. Strabismus 4. Seasonal allergies 5. Sinus disease 6. ABL anemia 7. Thrombocytopenia  Patient Active Problem List   Diagnosis Date Noted  . S/P minimally invasive mitral valve repair 05/16/2016  . S/P atrial septal defect closure 05/16/2016  . Chronic diastolic congestive heart failure (HCC)   . Chronic kidney disease (CKD) 05/14/2016  . Severe mitral regurgitation 04/18/2016  . Atrial septal defect    Procedure (s):   Minimally-Invasive Mitral Valve Repair             Complex valvuloplasty including triangular resection of flail segment of posterior leaflet             Artificial Gore-tex neochord placement x6             Sorin Memo 3D Ring Annuloplasty (size 34 mm, catalog # [id number], serial # [id number])             Closure of atrial septal defect             Clipping of left atrial appendage Dr. Cornelius Moras 05/16/2016.  History of Presenting Illness: Patient is a 56 year old male with known history of mitral valve prolapse who has been referred for second opinion for possible surgical treatment of severe symptomatic primary mitral regurgitation. Patient states that he has known about the presence of a heart murmur for nearly 20 years. Over the past year he has noted the development of gradual progression of symptoms of exertional shortness of breath and occasional chest tightness. He was seen in follow-up by his primary care physician and a transthoracic echocardiogram performed demonstrating the presence of mitral valve prolapse with what appear to be a flail  segment of posterior leaflet and severe mitral regurgitation. Left ventricular systolic function remained normal with ejection fraction estimated 65%. There was flow reversal in the pulmonary veins, severe left atrial enlargement, and mildly enlarged left ventricle. The patient subsequently underwent transesophageal echocardiogram and diagnostic cardiac catheterization. TEE confirmed the presence of myxomatous degenerative disease of the mitral valve with a large flail segment involving the middle scallop of the posterior leaflet with multiple ruptured chordae tendineae. There was severe mitral regurgitation. There was flow reversal in the pulmonary veins. The regurgitant volume was calculated 197 mL. There was severe left atrial enlargement. There was a small atrial septal defect versus stretched patent foramen ovale. Left ventricular systolic function appeared normal with ejection fraction calculated 55-65%. No other significant abnormalities were noted. Diagnostic cardiac catheterization was notable for the presence of normal coronary arteries with no significant coronary artery disease. Right heart catheterization was not performed. The patient was referred for surgical consultation and has seen a Careers adviser in [place]. The patient has requested a second opinion with interested in exploring the possibility of minimally invasive approach for surgery.  The patient is single and lives alone in [place]. He is a Technical sales engineer. He does not exercise on a regular basis but he reports no significant physical limitations other than problems with exertional shortness of breath and fatigue. The  patient states that he has had a long history of some degree of exertional shortness of breath. Symptoms have gradually progressed over the past year, particularly over the last 4-6 weeks. The patient now gets short of breath just going up a single flight of steps. He denies any history of resting shortness of  breath, PND, orthopnea, or lower extremity edema. He has had some transient dizzy spells without syncope. He reports some mild tightness across his chest that seems to be sporadic and not necessarily associated with physical activity or shortness of breath. He recently has had productive cough for the last several weeks. He thinks he may have had a flulike syndrome several weeks ago. All of his symptoms have resolved with exception of a mild residual cough.  Patient has stage D severe symptomatic primary mitral regurgitation caused by mitral valve prolapse with a large flail segment involving the middle scallop of the posterior leaflet. He presents with progressive symptoms of exertional shortness of breath and fatigue consistent with chronic diastolic congestive heart failure, [place] Heart Association function class II. The patient also experiences occasional episodes of dizziness without syncope and transient atypical symptoms of chest discomfort that occur both with and without physical activity. I have personally reviewed the patient's recent transesophageal echocardiogram and diagnostic cardiac catheterization. 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 The patient also has what appears to be a relatively large patent foramen ovale or small secundum type atrial septal defect. Dr. Cornelius Moraswen agreed the patient needs elective mitral valve repair and closure of his atrial septal defect. Risks associated with elective surgery should be extremely low,and based upon review of the patient's transesophageal echocardiogram there is a very high likelihood that his valve should be repairable with an excellent and durable long-term result. He appears to be relatively good candidate for minimally invasive approach for surgery. Routine preoperative blood work performed earlier today revealed abnormal serum  creatinine measured 1.5. Potential risks, benefits, and complications of the surgery were discussed with the patient and he agreed to proceed with surgery. He was admitted on 05/16/2016 in order to undergo a minimally invasive mitral valve repair, closure of ASD, and clipping of LA appendage.   Brief Hospital Course:  The patient was extubated the morning of post operative day one.  He remained afebrile and hemodynamically stable. Theone MurdochSwan Ganz, a line, chest tubes, and foley were removed early in the post operative course. He was weaned off of Neo Synephrine, Dopamine, and Milrinone drips. He was initially DDD paced.  He was volume over loaded and diuresed. He had ABL anemia. He did not require a post op transfusion. Last H and H was 9.7/27.5. He was weaned off the insulin drip.The patient's HGA1C pre op was 4.8. He was started on Coumadin. His PT and INR were monitored daily. He is currently on 5 mg daily. His last INR was up to 1.2. The patient was felt surgically stable for transfer from the ICU to PCTU for further convalescence on 05/19/2016. He continues to progress with cardiac rehab.  He developed 2nd degree heart block. He was ambulating on room air. He has been tolerating a diet and has had a bowel movement. Epicardial pacing wires were removed on 03/122018. Chest tube sutures will be removed the day of discharge. The patient is felt surgically stable for discharge today.  Latest Vital Signs: Blood pressure 122/73, pulse 73, temperature 98.7 F (37.1  C), temperature source Oral, resp. rate 18, height 6\' 3"  (1.905 m), weight 205 lb 4 oz (93.1 kg), SpO2 93 %. General appearance: alert, cooperative and no distress Heart: regular rate and rhythm, S1, S2 normal, no murmur, click, rub or gallop Lungs: clear to auscultation bilaterally Abdomen: soft, non-tender; bowel sounds normal; no masses,  no organomegaly Extremities: 1+ pedal edema Wound: clean and dry  Physical Exam:  Minimally-Invasive  Mitral Valve Repair             Complex valvuloplasty including triangular resection of flail segment of posterior leaflet             Artificial Gore-tex neochord placement x6             Sorin Memo 3D Ring Annuloplasty (size 34 mm, catalog # [id number], serial # [id number])             Closure of atrial septal defect             Clipping of left atrial appendage    Discharge Condition:Stable and discharged to families home  Recent laboratory studies:  Lab Results  Component Value Date   WBC 8.4 05/20/2016   HGB 9.7 (L) 05/20/2016   HCT 27.5 (L) 05/20/2016   MCV 88.7 05/20/2016   PLT 75 (L) 05/20/2016   Lab Results  Component Value Date   NA 131 (L) 05/21/2016   K 3.5 05/21/2016   CL 96 (L) 05/21/2016   CO2 26 05/21/2016   CREATININE 1.20 05/21/2016   GLUCOSE 106 (H) 05/21/2016    Diagnostic Studies:  Dg Chest Port 1 View  Result Date: 05/18/2016 CLINICAL DATA:  Chest tube present,,ATX EXAM: PORTABLE CHEST 1 VIEW COMPARISON:  05/17/2016 FINDINGS: There are 2 right-sided chest tubes. Left IJ central line tip overlies the superior vena cava. A left IJ sheath remains in place following removal of Swan-Ganz catheter. Left atrial appendage clip and valve replacement. Shallow lung inflation. The heart is enlarged and there is pulmonary vascular congestion. Persistent opacity in the right lower lobe obscures the hemidiaphragm. There is increased opacity in the left lower lobe in there are bilateral pleural effusions. Small right apical pneumothorax is approximately 5% of lung volume. IMPRESSION: 1. Right apical pneumothorax, estimated to be approximately 5% of lung volume. 2. Cardiomegaly and pulmonary vascular congestion. 3. Bilateral lower lobe infiltrates, increased on the left. These results will be called to the ordering clinician or representative by the Radiologist Assistant, and communication documented in the PACS or zVision Dashboard. Electronically Signed   By: Norva Pavlov M.D.   On:  05/18/2016 09:14  Discharge Instructions    Discharge patient    Complete by:  As directed    Patient is to get INR drawn on Friday 05/25/2016 at [facility] and results to Dr. Cornelius Moras for dosing.   Discharge disposition:  01-Home or Self Care   Discharge patient date:  05/22/2016     Discharge Medications: Allergies as of 05/22/2016      Reactions   No Known Allergies       Medication List    STOP taking these medications   FISH OIL PO   ibuprofen 200 MG tablet Commonly known as:  ADVIL,MOTRIN     TAKE these medications   amiodarone 200 MG tablet Commonly known as:  PACERONE Take 1 tablet (200 mg total) by mouth daily.   fluticasone 50 MCG/ACT nasal spray Commonly known as:  FLONASE Place 1-2 sprays into both nostrils daily as needed  for allergies.   furosemide 40 MG tablet Commonly known as:  LASIX Take 1 tablet (40 mg total) by mouth daily.   loratadine 10 MG tablet Commonly known as:  CLARITIN Take 10 mg by mouth daily.   multivitamin with minerals Tabs tablet Take 1 tablet by mouth daily.   oxyCODONE 5 MG immediate release tablet Commonly known as:  Oxy IR/ROXICODONE Take 1-2 tablets (5-10 mg total) by mouth every 6 (six) hours as needed for severe pain.   potassium chloride SA 20 MEQ tablet Commonly known as:  K-DUR,KLOR-CON Take 1 tablet (20 mEq total) by mouth daily.   warfarin 5 MG tablet Commonly known as:  COUMADIN Take 1 tablet (5 mg total) by mouth daily at 6 PM.     The patient has been discharged on:   1.Beta Blocker:  Yes [   ]                              No   [n  ]                              If No, reason:heart block  2.Ace Inhibitor/ARB: Yes [   ]                                     No  [n    ]                                     If No, reason:slightly elevated creat/BP well controlled  3.Statin:   Yes [   ]                  No  [ n  ]                  If No, reason:no CAD/hyperlipidemia  4.Marlowe Kays:  Yes  [   ]                  No   [  n  ]                  If No, reason:no CAD/ on full dose coumadin  Follow Up Appointments: Follow-up Information    Purcell Nails, MD Follow up on 06/04/2016.   Specialty:  Cardiothoracic Surgery Why:  PA/LAT CXR to be taken (at [facility] which is in the same building as Dr. Orvan July office) one hour prior to office appointment. Office will call with appointment date and time. Contact information: [address] [employer] [address] [place] [address] [phone]        Ileene Patrick Follow up.   Why:  Call for a follow up appointment       [facility]. Go on 05/26/2016.   Why:  obtain PT/INR blood test in morning- results to be called to Dr Cornelius Moras to adjust coumadin dose. Nurse to make appointment Contact information: [address]  [place], [place] [address]       nursing appointment. Call in 1 day(s).   Why:  Please call our office for a suture removal appointment next week.  Contact information: Dr. Orvan July office          Signed:  GOLD,WAYNE EPA-C 05/28/2016, 8:57 AM

## 2016-05-19 LAB — BASIC METABOLIC PANEL
Anion gap: 10 (ref 5–15)
BUN: 18 mg/dL (ref 6–20)
CHLORIDE: 96 mmol/L — AB (ref 101–111)
CO2: 26 mmol/L (ref 22–32)
Calcium: 8.3 mg/dL — ABNORMAL LOW (ref 8.9–10.3)
Creatinine, Ser: 1.28 mg/dL — ABNORMAL HIGH (ref 0.61–1.24)
GFR calc non Af Amer: >60 mL/min (ref >60)
Glucose, Bld: 105 mg/dL — ABNORMAL HIGH (ref 65–99)
Potassium: 3.5 mmol/L (ref 3.5–5.1)
Sodium: 132 mmol/L — ABNORMAL LOW (ref 135–145)

## 2016-05-19 LAB — CBC
HEMATOCRIT: 27.9 % — AB (ref 39.0–52.0)
HEMOGLOBIN: 9.4 g/dL — AB (ref 13.0–17.0)
MCH: 30 pg (ref 26.0–34.0)
MCHC: 33.7 g/dL (ref 30.0–36.0)
MCV: 89.1 fL (ref 78.0–100.0)
Platelets: 46 10*3/uL — ABNORMAL LOW (ref 150–400)
RBC: 3.13 MIL/uL — ABNORMAL LOW (ref 4.22–5.81)
RDW: 14.3 % (ref 11.5–15.5)
WBC: 11.3 10*3/uL — ABNORMAL HIGH (ref 4.0–10.5)

## 2016-05-19 LAB — PROTIME-INR
INR: 1.33
Prothrombin Time: 16.6 seconds — ABNORMAL HIGH (ref 11.4–15.2)

## 2016-05-19 MED ORDER — AMIODARONE HCL 200 MG PO TABS: 200.0000 mg | ORAL_TABLET | Freq: Every day | ORAL | Status: DC

## 2016-05-19 MED ORDER — FUROSEMIDE 10 MG/ML IJ SOLN: 20.0000 mg | Freq: Four times a day (QID) | INTRAMUSCULAR | Status: DC

## 2016-05-19 MED ORDER — FUROSEMIDE 10 MG/ML IJ SOLN
40.0000 mg | Freq: Two times a day (BID) | INTRAMUSCULAR | Status: AC
  Administered 2016-05-19 (×2): 40 mg via INTRAVENOUS
  Filled 2016-05-19 (×2): qty 4

## 2016-05-19 MED ORDER — POLYSACCHARIDE IRON COMPLEX 150 MG PO CAPS
150.0000 mg | ORAL_CAPSULE | Freq: Every day | ORAL | Status: DC
  Administered 2016-05-20 – 2016-05-22 (×3): 150 mg via ORAL
  Filled 2016-05-19 (×3): qty 1

## 2016-05-19 MED ORDER — FUROSEMIDE 40 MG PO TABS: 40.0000 mg | ORAL_TABLET | Freq: Two times a day (BID) | ORAL | Status: DC

## 2016-05-19 MED ORDER — POTASSIUM CHLORIDE CRYS ER 20 MEQ PO TBCR
40.0000 meq | EXTENDED_RELEASE_TABLET | Freq: Two times a day (BID) | ORAL | Status: AC
  Administered 2016-05-19 (×2): 40 meq via ORAL
  Filled 2016-05-19 (×2): qty 2

## 2016-05-19 MED ORDER — SODIUM CHLORIDE 0.9 % IV SOLN
30.0000 meq | Freq: Once | INTRAVENOUS | Status: AC
  Administered 2016-05-19: 30 meq via INTRAVENOUS
  Filled 2016-05-19: qty 15

## 2016-05-19 MED ORDER — FA-PYRIDOXINE-CYANOCOBALAMIN 2.5-25-2 MG PO TABS
1.0000 | ORAL_TABLET | Freq: Every day | ORAL | Status: DC
  Administered 2016-05-19 – 2016-05-22 (×4): 1 via ORAL
  Filled 2016-05-19 (×4): qty 1

## 2016-05-19 NOTE — Progress Notes (Signed)
      [address]       [place] [id number]             [phone]        CARDIOTHORACIC SURGERY PROGRESS NOTE   R3 Days Post-Op Procedure(s) (LRB): MINIMALLY INVASIVE MITRAL VALVE REPAIR (MVR) (Right) ATRIAL SEPTAL DEFECT (ASD) REPAIR (N/A) TRANSESOPHAGEAL ECHOCARDIOGRAM (TEE) (N/A) CLIPPING OF ATRIAL APPENDAGE  Subjective: Looks good.  Minimal pain.  Denies SOB.  Appetite improving.  Didn't sleep much.  Objective: Vital signs: BP Readings from Last 1 Encounters:  05/19/16 123/80   Pulse Readings from Last 1 Encounters:  05/19/16 79   Resp Readings from Last 1 Encounters:  05/19/16 19   Temp Readings from Last 1 Encounters:  05/19/16 98.7 F (37.1 C) (Oral)    Hemodynamics:    Physical Exam:  Rhythm:   Sinus 70  Breath sounds: clear  Heart sounds:  RRR w/out murmur  Incisions:  Dressings dry, intact  Abdomen:  Soft, non-distended, non-tender  Extremities:  Warm, well-perfused  Chest tubes:  Decreasing but significant volume thin serosanguinous output, no air leak     Intake/Output from previous day: 03/09 0701 - 03/10 0700 In: 1499.3 [P.O.:1080; I.V.:74.3; IV Piggyback:315] Out: 2130 [[id number] [Urine:1295; Chest Tube:550] Intake/Output this shift: Total I/O In: -  Out: 250 [Urine:250]  Lab Results:  CBC: Recent Labs  05/18/16 0345 05/19/16 0343  WBC 18.1* 11.3*  HGB 10.0* 9.4*  HCT 29.9* 27.9*  PLT 57* 46*    BMET:  Recent Labs  05/18/16 0345 05/19/16 0343  NA 135 132*  K 3.8 3.5  CL 102 96*  CO2 26 26  GLUCOSE 112* 105*  BUN 15 18  CREATININE 1.24 1.28*  CALCIUM 8.2* 8.3*     PT/INR:   Recent Labs  05/19/16 0343  LABPROT 16.6*  INR 1.33    CBG (last 3)   Recent Labs  05/17/16 1910 05/18/16 0007 05/18/16 0325  GLUCAP 100* 96 87    ABG    Component Value Date/Time   PHART 7.364 05/17/2016 0758   PCO2ART 39.1 05/17/2016 0758   PO2ART 71.0 (L) 05/17/2016 0758   HCO3 22.2 05/17/2016 0758   TCO2 23  05/17/2016 1654   ACIDBASEDEF 3.0 (H) 05/17/2016 0758   O2SAT 93.0 05/17/2016 0758    CXR: n/a  Assessment/Plan: S/P Procedure(s) (LRB): MINIMALLY INVASIVE MITRAL VALVE REPAIR (MVR) (Right) ATRIAL SEPTAL DEFECT (ASD) REPAIR (N/A) TRANSESOPHAGEAL ECHOCARDIOGRAM (TEE) (N/A) CLIPPING OF ATRIAL APPENDAGE  Overall stable POD3 Sinus rhythm w/ stable BP off all drips  Breathing comfortably w/ O2 sats 96-98% on 2 L/min  93% on RA Acute on chronic diastolic CHF with expected post-op volume excess, weight approx 10 lbs > preop Expected post op acute blood loss anemia, mild, Hgb 9.4 this morning Post op thrombocytopenia, platelet count down 46k Chronic kidney disease, creatinine stable and still below preop baseline and making good UOP so far Expected post op atelectasis, mild Hypokalemia, induced by loop diuretics   Continue to hold ASA, avoid heparin/lovenox, and watch platelet count, check HIT assay  Mobilize  Diuresis  Coumadin  Leave chest tubes at least 1 more day  Supplement potassium  Transfer step down   Purcell Nailslarence H Chrissa Meetze, MD 05/19/2016 8:36 AM

## 2016-05-20 LAB — BASIC METABOLIC PANEL
ANION GAP: 11 (ref 5–15)
BUN: 22 mg/dL — ABNORMAL HIGH (ref 6–20)
CHLORIDE: 96 mmol/L — AB (ref 101–111)
CO2: 25 mmol/L (ref 22–32)
Calcium: 8.5 mg/dL — ABNORMAL LOW (ref 8.9–10.3)
Creatinine, Ser: 1.4 mg/dL — ABNORMAL HIGH (ref 0.61–1.24)
GFR calc Af Amer: >60 mL/min (ref >60)
GFR calc non Af Amer: 55 mL/min — ABNORMAL LOW (ref >60)
Glucose, Bld: 102 mg/dL — ABNORMAL HIGH (ref 65–99)
POTASSIUM: 3.8 mmol/L (ref 3.5–5.1)
Sodium: 132 mmol/L — ABNORMAL LOW (ref 135–145)

## 2016-05-20 LAB — CBC
HEMATOCRIT: 27.5 % — AB (ref 39.0–52.0)
HEMOGLOBIN: 9.7 g/dL — AB (ref 13.0–17.0)
MCH: 31.3 pg (ref 26.0–34.0)
MCHC: 35.3 g/dL (ref 30.0–36.0)
MCV: 88.7 fL (ref 78.0–100.0)
Platelets: 75 10*3/uL — ABNORMAL LOW (ref 150–400)
RBC: 3.1 MIL/uL — AB (ref 4.22–5.81)
RDW: 14.2 % (ref 11.5–15.5)
WBC: 8.4 10*3/uL (ref 4.0–10.5)

## 2016-05-20 LAB — PROTIME-INR
INR: 1.12
Prothrombin Time: 14.4 seconds (ref 11.4–15.2)

## 2016-05-20 LAB — GLUCOSE, CAPILLARY: Glucose-Capillary: 103 mg/dL — ABNORMAL HIGH (ref 65–99)

## 2016-05-20 LAB — HEPARIN INDUCED PLATELET AB (HIT ANTIBODY): Heparin Induced Plt Ab: 0.139 OD (ref 0.000–0.400)

## 2016-05-20 MED ORDER — FUROSEMIDE 40 MG PO TABS
40.0000 mg | ORAL_TABLET | Freq: Every day | ORAL | Status: DC
  Administered 2016-05-20 – 2016-05-22 (×3): 40 mg via ORAL
  Filled 2016-05-20 (×3): qty 1

## 2016-05-20 MED ORDER — ACETAMINOPHEN 500 MG PO TABS
1000.0000 mg | ORAL_TABLET | Freq: Four times a day (QID) | ORAL | Status: DC | PRN
  Administered 2016-05-20 – 2016-05-21 (×6): 1000 mg via ORAL
  Filled 2016-05-20 (×6): qty 2

## 2016-05-20 NOTE — Progress Notes (Signed)
CCMD just called and report a rhythm changed: P wave not constantly followed by QRS. Pt is asymptomatic, we'll continue to monitor.

## 2016-05-20 NOTE — Progress Notes (Signed)
Dc'ed on -Q- pt tolerated well

## 2016-05-20 NOTE — Discharge Instructions (Signed)
Mitral Valve Repair, Care After This sheet gives you information about how to care for yourself after your procedure. Your health care provider may also give you more specific instructions. If you have problems or questions, contact your health care provider. What can I expect after the procedure? After the procedure, it is common to have:  Pain at the incision area that may last for several weeks. Follow these instructions at home: Incision care   Follow instructions from your health care provider about how to take care of your incision. Make sure you:  Wash your hands with soap and water before you change your bandage (dressing). If soap and water are not available, use hand sanitizer.  Change your dressing as told by your health care provider.  Leave stitches (sutures), skin glue, or adhesive strips in place. These skin closures may need to stay in place for 2 weeks or longer. If adhesive strip edges start to loosen and curl up, you may trim the loose edges. Do not remove adhesive strips completely unless your health care provider tells you to do that.  Check your incision area every day for signs of infection. Check for:  More redness, swelling, or pain.  More fluid or blood.  Warmth.  Pus or a bad smell.   Do not apply powder or lotion to the area. Driving   Do not drive until your health care provider approves.  Do not drive or use heavy machinery while taking prescription pain medicines. Bathing   Do not take baths, swim, or use a hot tub for 2-4 weeks after surgery, or until your health care provider approves. Ask your health care provider if you may take showers.  To wash the incision site, gently wash with soap and water and pat the area dry with a clean towel. Do not rub the incision area. That may cause bleeding. Activity   Rest as told by your health care provider. Ask your health care provider when you can resume normal activities, including sexual  activity.  Avoid the following activities for 6-8 weeks, or as long as directed:  Lifting anything that is heavier than 10 lb (4.5 kg), or the limit that your health care provider tells you.  Pushing or pulling things with your arms.  Avoid climbing stairs and using the handrail to pull yourself up for the first 2-3 weeks after surgery.  Avoid airplane travel for 4-6 weeks, or as long as directed.  Avoid sitting for long periods of time and crossing your legs. Get up and move around at least once every 1-2 hours.  If you are taking blood thinners (anticoagulants), avoid activities that have a high risk of injury. Ask your health care provider what activities are safe for you. Lifestyle   Limit alcohol intake to no more than 1 drink a day for nonpregnant women and 2 drinks a day for men. One drink equals 12 oz of beer, 5 oz of wine, or 1 oz of hard liquor.  Do not use any products that contain nicotine or tobacco, such as cigarettes and e-cigarettes. If you need help quitting, ask your health care provider. General instructions   Take your temperature every day and weigh yourself every morning for the first 7 days after surgery. Write your temperatures and weight down and take this record with you to any follow-up visits.  Take over-the-counter and prescription medicines only as told by your health care provider.  To prevent or treat constipation while you are taking prescription  pain medicine, your health care provider may recommend that you:  Drink enough fluid to keep your urine clear or pale yellow.  Take over-the-counter or prescription medicines.  Eat foods that are high in fiber, such as fresh fruits and vegetables, whole grains, and beans.  Limit foods that are high in fat and processed sugars, such as fried and sweet foods.  Follow instructions from your health care provider about eating or drinking restrictions.  Wear compression stockings for at least 2 weeks, or as  long as told by your health care provider. These stockings help to prevent blood clots and reduce swelling in your legs. If your ankles are swollen after 2 weeks, continue to wear the stockings.  Keep all follow-up visits as told by your health care provider. This is important. Contact a health care provider if:  You develop a skin rash.  Your weight is increasing each day over 2-3 days.  You gain 2 lb (1 kg) or more in a single day.  You have a fever. Get help right away if:  You develop chest pain that feels different from the pain caused by your incision.  You develop shortness of breath or difficulty breathing.  You have more redness, swelling, or pain around your incision.  You have more fluid or blood coming from your incision.  Your incision feels warm to the touch.  You have pus or a bad smell coming from your incision.  You feel light-headed. This information is not intended to replace advice given to you by your health care provider. Make sure you discuss any questions you have with your health care provider. Document Released: 09/15/2004 Document Revised: 12/09/2015 Document Reviewed: 12/09/2015 Elsevier Interactive Patient Education  2017 [employer]  What You Need to Know About Warfarin Warfarin is a blood thinner (anticoagulant). Anticoagulants help to prevent the formation of blood clots. They also help to stop the growth of blood clots. Who should use warfarin? Warfarin is prescribed for people who are at risk for developing harmful blood clots, such as people who have:  Surgically implanted mechanical heart valves.  Irregular heart rhythms (atrial fibrillation).  Certain clotting disorders.  A history of harmful blood clotting in the past. This includes people who have had:  A stroke.  Blood clot in the lungs (pulmonary embolism, or PE).  Blood clot in the legs (deep vein thrombosis, or DVT).  An existing blood clot. How is warfarin taken?    Warfarin is a medicine that you take by mouth (orally). Warfarin tablets come in different strengths. Each tablet strength is a different color, with the amount of warfarin printed on the tablet. If you get a new prescription filled and the color of your tablet is different than usual, tell your pharmacist or health care provider immediately. What blood tests do I need while taking warfarin? The goal of warfarin therapy is to lessen the clotting tendency of blood, but not to prevent clotting completely. Your health care provider will monitor the anticoagulation effect of warfarin closely and will adjust your dose as needed. Warfarin is a medicine that needs to be closely monitored, so it is very important to keep all lab visits and follow-up visits with your health care provider. While taking warfarin, you will need to have blood tests (prothrombin tests, or PT tests) regularly to measure your blood clotting time. This type of test can be done with a finger stick or a blood draw. What does the INR test result mean?  The  PT test results will be reported as the International Normalized Ratio (INR). The INR tells your health care provider whether your dosage of warfarin needs to be changed. The longer it takes your blood to clot, the higher the INR. Your health care provider will tell you your target INR range. If your INR is not in your target range, your health care provider may adjust your dosage.  If your INR is above your target range, there is a risk of bleeding. Your dosage of warfarin may need to be decreased.  If your INR is below your target range, there is a risk of clotting. Your dosage of warfarin may need to be increased. How often is the INR test needed?   When you first start warfarin, you will usually have your INR checked every few days.  You may need to have INR tests done more than once a week until you are taking the correct dosage of warfarin.  After you have reached your target  INR, your INR will be tested less often. However, you will need to have your INR checked at least once every 4-6 weeks for the entire time you are taking warfarin. What are the side effects of warfarin? Too much warfarin can cause bleeding (hemorrhage) in any part of the body, such as:  Bleeding from the gums.  Unexplained bruises.  Bruises that get larger.  Blood in the urine.  Bloody or dark stools.  Bleeding in the brain (hemorrhagic stroke).  A nosebleed that is not easily stopped.  Coughing up blood.  Vomiting blood. Warfarin use may also cause:  Skin rash or irritations  Nausea that does not go away.  Severe pain in the back or joints.  Painful toes that turn blue or purple (purple toe syndrome).  Painful ulcers that do not go away (skin necrosis). What are the signs and symptoms of a blood clot? Too little warfarin can increase the risk of blood clots in your legs, lungs, or arms. Signs and symptoms of a DVT in your leg or arm may include:  Pain or swelling in your leg or arm.  Skin that is red or warm to the touch on your arm or leg. Signs and symptoms of a pulmonary embolism may include:  Shortness of breath or difficulty breathing.  Chest pain.  Unexplained fever. What are the signs and symptoms of a stroke? If you are taking too much or too little warfarin, you can have a stroke. Signs and symptoms of a stroke may include:  Weakness or numbness of your face, arm, or leg, especially on one side of your body.  Confusion or trouble thinking clearly.  Difficulty seeing with one or both eyes.  Difficulty walking or moving your arms or legs.  Dizziness.  Loss of balance or coordination.  Trouble speaking, trouble understanding speech, or both (aphasia).  Sudden, severe headache with no known cause.  Partial or total loss of consciousness. What precautions do I need to take while using warfarin?   Take warfarin exactly as told by your health  care provider. Doing this helps you avoid bleeding or blood clots that could result in serious injury, pain, or disability.  Take your medicine at the same time every day. If you forget to take your dose of warfarin, take it as soon as you remember that day. If you do not remember on that day, do not take an extra dose the next day.  Contact your health care provider if you miss or  take an extra dose. Do not change your dosage on your own to make up for missed or extra doses.  Wear or carry identification that says that you are taking warfarin.  Make sure that all health care providers, including your dentist, know you are taking warfarin.  If you need surgery, talk with your health care provider about whether you should stop taking warfarin before your surgery.  Avoid situations that cause bleeding. You may bleed more easily while taking warfarin. To limit bleeding, take the following actions:  Use a softer toothbrush.  Floss with waxed floss, not unwaxed floss.  Shave with an electric razor, not with a blade.  Limit your use of sharp objects.  Avoid potentially harmful activities, such as contact sports. What do I need to know about warfarin and pregnancy or breastfeeding?  Warfarin is not recommended during the first trimester of pregnancy due to an increased risk of birth defects. In certain situations, a woman may take warfarin after her first trimester of pregnancy.  If you are taking warfarin and you become pregnant or plan to become pregnant, contact your health care provider right away.  If you plan to breastfeed while taking warfarin, talk with your health care provider first. What do I need to know about warfarin and alcohol or drug use?  Avoid drinking alcohol, or limit alcohol intake to no more than 1 drink a day for nonpregnant women and 2 drinks a day for men. One drink equals 12 oz of beer, 5 oz of wine, or 1 oz of hard liquor.  If you change the amount of alcohol  that you drink, tell your health care provider. Your warfarin dosage may need to be changed.    Avoid street drugs while taking warfarin. The effects of street drugs on warfarin are not known. What do I need to know about warfarin and other medicines or supplements?  Many prescription and over-the-counter medicines can interfere with warfarin. Talk with your health care provider or your pharmacist before starting or stopping any new medicines. This includes over-the-counter vitamins, dietary supplements, herbal medicines, and pain medicines. Your warfarin dosage may need to be adjusted.  Some common over-the-counter medicines that may increase the risk of bleeding while taking warfarin include:  Acetaminophen.    NSAIDs, such as ibuprofen or naproxen.  Vitamin E. What do I need to know about warfarin and my diet?  It is important to maintain a normal, balanced diet while taking warfarin. Avoid major changes in your diet. If you are going to change your diet, talk with your health care provider before making changes.  Your health care provider may recommend that you work with a diet and nutrition specialist (dietitian).  Vitamin K decreases the effect of warfarin, and it is found in many foods. Eat a consistent amount of foods that contain vitamin K. For example, you may decide to eat 2 vitamin K-containing foods each day. Most foods that are high in vitamin K are green and leafy. Common foods that contain high amounts of vitamin K include:  Kale, raw or cooked.  Spinach, raw or cooked.  Collards, raw or cooked.  Swiss chard, raw or cooked.  Mustard greens, raw or cooked.  Turnip greens, raw or cooked.  Parsley, raw.  Broccoli, cooked.  Noodles, eggs, and spinach, enriched.  Brussels sprouts, raw or cooked.  Beet greens, raw or cooked.  Endive, raw.  Cabbage, cooked.  Asparagus, cooked. Foods that contain moderate amounts of vitamin K include:  Broccoli,  raw.  Cabbage, raw.  Bok choy, cooked.  Green leaf lettuce, raw  Prunes, stewed.  Rosita FirePickles.  Kiwi.  Edamame, cooked.  Romaine lettuce, raw.  Avocado.  Tuna, canned in oil.  Okra, cooked.  Black-eyed peas, cooked.  Green beans, cooked or raw.  Blueberries, raw.  Blackberries, raw.  Peas, cooked or raw. Contact a health care provider if:  You miss a dose.  You take an extra dose.  You plan to have any kind of surgery or procedure.  You are unable to take your medicine due to nausea, vomiting, or diarrhea.  You have any major changes in your diet or you plan to make any major changes in your diet.  You start or stop any over-the-counter medicine, prescription medicine, or dietary supplement.  You become pregnant, plan to become pregnant, or think you may be pregnant.  You have menstrual periods that are heavier than usual.  You have unusual bruising. Get help right away if:  You develop symptoms of an allergic reaction, such as:  Swelling of the lips, face, tongue, mouth, or throat.  Rash.  Itching.  Itchy, red, swollen areas of skin (hives).  Trouble breathing.  Chest tightness.  You have:  Signs or symptoms of a stroke.  Signs or symptoms of a blood clot.  A fall or have an accident, especially if you hit your head.  Blood in your urine. Your urine may look reddish, pinkish, or tea-colored.  Blood in your stool. Your stool may be black or bright red.  Bleeding that does not stop after applying pressure to the area for 30 minutes.  Severe pain in your joints or back.  Purple or blue toes.  Skin ulcers that do not go away.  You vomit blood or cough up blood. The blood may be bright red, or it may look like coffee grounds. These symptoms may represent a serious problem that is an emergency. Do not wait to see if the symptoms will go away. Get medical help right away. Call your local emergency services (911 in the [place] Do not drive  yourself to the hospital. Summary  Warfarin needs to be closely monitored with blood tests. It is very important to keep all lab visits and follow-up visits with your health care provider.  Make sure that you know your target INR range and your warfarin dosage.  Wear or carry identification that says that you are taking warfarin.  Take warfarin at the same time every day. Call your health care provider if you miss a dose or if you take an extra dose. Do not change the dosage of warfarin on your own.  Know the signs and symptoms of blood clots, bleeding, and a stroke. Know when to get emergency medical help.  Tell all health care providers who care for you that you are taking warfarin.  Talk with your health care provider or your pharmacist before starting or stopping any new medicines.  Monitor how much vitamin K you eat every day. Try to eat the same amount every day. This information is not intended to replace advice given to you by your health care provider. Make sure you discuss any questions you have with your health care provider. Document Released: 02/26/2005 Document Revised: 11/08/2015 Document Reviewed: 05/25/2015 Elsevier Interactive Patient Education  2017 [employer] ----------------  Information on my medicine - Coumadin   (Warfarin)  This medication education was reviewed with me or my healthcare representative as part of my discharge preparation.  The pharmacist that spoke with me during my hospital stay was:  Benny Lennert, [facility]  Why was Coumadin prescribed for you? Coumadin was prescribed for you because you have a blood clot or a medical condition that can cause an increased risk of forming blood clots. Blood clots can cause serious health problems by blocking the flow of blood to the heart, lung, or brain. Coumadin can prevent harmful blood clots from forming. As a reminder your indication for Coumadin is:   Blood Clot Prevention After Heart Valve  Surgery  What test will check on my response to Coumadin? While on Coumadin (warfarin) you will need to have an INR test regularly to ensure that your dose is keeping you in the desired range. The INR (international normalized ratio) number is calculated from the result of the laboratory test called prothrombin time (PT).  If an INR APPOINTMENT HAS NOT ALREADY BEEN MADE FOR YOU please schedule an appointment to have this lab work done by your health care provider within 7 days. Your INR goal is usually a number between:  2 to 3 or your provider may give you a more narrow range like 2-2.5.  Ask your health care provider during an office visit what your goal INR is.  What  do you need to  know  About  COUMADIN? Take Coumadin (warfarin) exactly as prescribed by your healthcare provider about the same time each day.  DO NOT stop taking without talking to the doctor who prescribed the medication.  Stopping without other blood clot prevention medication to take the place of Coumadin may increase your risk of developing a new clot or stroke.  Get refills before you run out.  What do you do if you miss a dose? If you miss a dose, take it as soon as you remember on the same day then continue your regularly scheduled regimen the next day.  Do not take two doses of Coumadin at the same time.  Important Safety Information A possible side effect of Coumadin (Warfarin) is an increased risk of bleeding. You should call your healthcare provider right away if you experience any of the following: ? Bleeding from an injury or your nose that does not stop. ? Unusual colored urine (red or dark brown) or unusual colored stools (red or black). ? Unusual bruising for unknown reasons. ? A serious fall or if you hit your head (even if there is no bleeding).  Some foods or medicines interact with Coumadin (warfarin) and might alter your response to warfarin. To help avoid this: ? Eat a balanced diet, maintaining a  consistent amount of Vitamin K. ? Notify your provider about major diet changes you plan to make. ? Avoid alcohol or limit your intake to 1 drink for women and 2 drinks for men per day. (1 drink is 5 oz. wine, 12 oz. beer, or 1.5 oz. liquor.)  Make sure that ANY health care provider who prescribes medication for you knows that you are taking Coumadin (warfarin).  Also make sure the healthcare provider who is monitoring your Coumadin knows when you have started a new medication including herbals and non-prescription products.  Coumadin (Warfarin)  Major Drug Interactions  Increased Warfarin Effect Decreased Warfarin Effect  Alcohol (large quantities) Antibiotics (esp. Septra/Bactrim, Flagyl, Cipro) Amiodarone (Cordarone) Aspirin (ASA) Cimetidine (Tagamet) Megestrol (Megace) NSAIDs (ibuprofen, naproxen, etc.) Piroxicam (Feldene) Propafenone (Rythmol SR) Propranolol (Inderal) Isoniazid (INH) Posaconazole (Noxafil) Barbiturates (Phenobarbital) Carbamazepine (Tegretol) Chlordiazepoxide (Librium) Cholestyramine (Questran) Griseofulvin Oral Contraceptives Rifampin Sucralfate (  Carafate) Vitamin K   Coumadin (Warfarin) Major Herbal Interactions  Increased Warfarin Effect Decreased Warfarin Effect  Garlic Ginseng Ginkgo biloba Coenzyme Q10 Green tea St. Johns wort    Coumadin (Warfarin) FOOD Interactions  Eat a consistent number of servings per week of foods HIGH in Vitamin K (1 serving =  cup)  Collards (cooked, or boiled & drained) Kale (cooked, or boiled & drained) Mustard greens (cooked, or boiled & drained) Parsley *serving size only =  cup Spinach (cooked, or boiled & drained) Swiss chard (cooked, or boiled & drained) Turnip greens (cooked, or boiled & drained)  Eat a consistent number of servings per week of foods MEDIUM-HIGH in Vitamin K (1 serving = 1 cup)  Asparagus (cooked, or boiled & drained) Broccoli (cooked, boiled & drained, or raw & chopped) Brussel  sprouts (cooked, or boiled & drained) *serving size only =  cup Lettuce, raw (green leaf, endive, romaine) Spinach, raw Turnip greens, raw & chopped   These websites have more information on Coumadin (warfarin):  http://www.king-russell.com/; https://www.hines.net/;

## 2016-05-20 NOTE — Progress Notes (Addendum)
[address]       [place] [id number]             [phone]      4 Days Post-Op Procedure(s) (LRB): MINIMALLY INVASIVE MITRAL VALVE REPAIR (MVR) (Right) ATRIAL SEPTAL DEFECT (ASD) REPAIR (N/A) TRANSESOPHAGEAL ECHOCARDIOGRAM (TEE) (N/A) CLIPPING OF ATRIAL APPENDAGE   Subjective:  Doing okay.  He does state he gets dizzy with ambulation at times.  He also states occasionally overnight he got a "jolt" from his heart.  + BM  Objective: Vital signs in last 24 hours: Temp:  [97.5 F (36.4 C)-100.5 F (38.1 C)] 97.5 F (36.4 C) (03/11 0448) Pulse Rate:  [56-78] 56 (03/11 0448) Cardiac Rhythm: Normal sinus rhythm (03/11 0701) Resp:  [18-27] 18 (03/11 0448) BP: (92-125)/(65-94) 125/72 (03/11 0448) SpO2:  [90 %-100 %] 93 % (03/11 0448) Weight:  [204 lb 9.6 oz (92.8 kg)] 204 lb 9.6 oz (92.8 kg) (03/11 0514)  Intake/Output from previous day: 03/10 0701 - 03/11 0700 In: 450 [P.O.:450] Out: 3795 [Urine:3475; Chest Tube:320]  General appearance: alert, cooperative and no distress Heart: regular rate and rhythm Lungs: clear to auscultation bilaterally Abdomen: soft, non-tender; bowel sounds normal; no masses,  no organomegaly Extremities: extremities normal, atraumatic, no cyanosis or edema Wound: clean and dry  Lab Results:  Recent Labs  05/19/16 0343 05/20/16 0430  WBC 11.3* 8.4  HGB 9.4* 9.7*  HCT 27.9* 27.5*  PLT 46* 75*   BMET:  Recent Labs  05/19/16 0343 05/20/16 0430  NA 132* 132*  K 3.5 3.8  CL 96* 96*  CO2 26 25  GLUCOSE 105* 102*  BUN 18 22*  CREATININE 1.28* 1.40*  CALCIUM 8.3* 8.5*    PT/INR:  Recent Labs  05/19/16 0343  LABPROT 16.6*  INR 1.33   ABG    Component Value Date/Time   PHART 7.364 05/17/2016 0758   HCO3 22.2 05/17/2016 0758   TCO2 23 05/17/2016 1654   ACIDBASEDEF 3.0 (H) 05/17/2016 0758   O2SAT 93.0 05/17/2016 0758   CBG (last 3)   Recent Labs  05/17/16 1910 05/18/16 0007 05/18/16 0325  GLUCAP  100* 96 87    Assessment/Plan: S/P Procedure(s) (LRB): MINIMALLY INVASIVE MITRAL VALVE REPAIR (MVR) (Right) ATRIAL SEPTAL DEFECT (ASD) REPAIR (N/A) TRANSESOPHAGEAL ECHOCARDIOGRAM (TEE) (N/A) CLIPPING OF ATRIAL APPENDAGE  1. CV- 2nd degree HB with 2:1 conduction at times, rate int he 50s, BP ok-will continue to hold nodal blocking agents 2. INR- not completed this morning, however minimal response to 2.5 mg coumadin, will likely need 5 mg daily 3. Pulm- chest tube output 320 cc, leave in place today.. Not on oxygen, continue pulm toilet... Will repeat CXR in AM 4. Renal- creatinine at 1.40, weight is below baseline, I/O are negative... Will decrease lasix to once daily and repeat BMET in AM 5. ID- isolated temp yesterday, no leukocytosis, no signs/sx of infection, likely SIRS 6. Thrombocytopenia- improve to 75K today, off all heparin agents, HIT pending 7. Dispo- patient with new 2nd degree HB, increase coumadin, leave chest tubes with output >300, repeat CXR, labs in AM, continue current care   LOS: 4 days    BARRETT, ERIN 05/20/2016   I have seen and examined the patient and agree with the assessment and plan as outlined.  Overall doing well.  Primarily NSR w/ some intermittent 2nd degree AV block.  Continue to hold beta blockers and amiodarone and observe.  Trevor Nailslarence H Jaleyah Longhi, MD 05/20/2016 11:26 AM

## 2016-05-21 ENCOUNTER — Inpatient Hospital Stay (HOSPITAL_COMMUNITY): Payer: Commercial Managed Care - PPO

## 2016-05-21 ENCOUNTER — Other Ambulatory Visit: Payer: Self-pay | Admitting: *Deleted

## 2016-05-21 DIAGNOSIS — Z7901 Long term (current) use of anticoagulants: Secondary | ICD-10-CM

## 2016-05-21 LAB — BASIC METABOLIC PANEL
Anion gap: 9 (ref 5–15)
BUN: 18 mg/dL (ref 6–20)
CO2: 26 mmol/L (ref 22–32)
Calcium: 8.5 mg/dL — ABNORMAL LOW (ref 8.9–10.3)
Chloride: 96 mmol/L — ABNORMAL LOW (ref 101–111)
Creatinine, Ser: 1.2 mg/dL (ref 0.61–1.24)
GFR calc Af Amer: >60 mL/min (ref >60)
GLUCOSE: 106 mg/dL — AB (ref 65–99)
POTASSIUM: 3.5 mmol/L (ref 3.5–5.1)
Sodium: 131 mmol/L — ABNORMAL LOW (ref 135–145)

## 2016-05-21 LAB — PROTIME-INR
INR: 1.21
Prothrombin Time: 15.4 seconds — ABNORMAL HIGH (ref 11.4–15.2)

## 2016-05-21 MED ORDER — POTASSIUM CHLORIDE CRYS ER 20 MEQ PO TBCR
40.0000 meq | EXTENDED_RELEASE_TABLET | Freq: Every day | ORAL | Status: DC
Start: 2016-05-21 — End: 2016-05-22
  Administered 2016-05-21 – 2016-05-22 (×2): 40 meq via ORAL
  Filled 2016-05-21 (×2): qty 2

## 2016-05-21 MED ORDER — AMIODARONE HCL 200 MG PO TABS
200.0000 mg | ORAL_TABLET | Freq: Every day | ORAL | Status: DC
  Administered 2016-05-21 – 2016-05-22 (×2): 200 mg via ORAL
  Filled 2016-05-21: qty 1

## 2016-05-21 MED ORDER — WARFARIN SODIUM 5 MG PO TABS
5.0000 mg | ORAL_TABLET | Freq: Every day | ORAL | Status: DC
  Administered 2016-05-21: 5 mg via ORAL
  Filled 2016-05-21: qty 1

## 2016-05-21 MED ORDER — POTASSIUM CHLORIDE CRYS ER 20 MEQ PO TBCR
40.0000 meq | EXTENDED_RELEASE_TABLET | Freq: Once | ORAL | Status: AC
  Administered 2016-05-21: 40 meq via ORAL
  Filled 2016-05-21: qty 2

## 2016-05-21 NOTE — Progress Notes (Signed)
EPW d/c'd per order and per protocol. Tips intact. VSS. Pt educated on need for bedrest x 1h and q15m vitals. Call bell and phone within reach. Will continue to monitor. 

## 2016-05-21 NOTE — Progress Notes (Signed)
CT d/c'd per order and per protocol. Vaseline gauze applied. Pt and sister educated to let RN know if bandage feels wet. Call bell and phone within reach. Will continue to monitor.

## 2016-05-21 NOTE — Progress Notes (Signed)
CARDIAC REHAB PHASE I   PRE:  Rate/Rhythm: 75 JR    BP: sitting 119/77    SaO2: 96 RA  MODE:  Ambulation: 350 ft   POST:  Rate/Rhythm: 75 JR    BP: sitting 120/75     SaO2: 86-87 RA,  Up to 93 RA with rest and PLB  Pt moving well, able to stand and walk with RW, no assist needed except with equipment. Pt with slow pace, tired and SOB after walk. SaO2 down. Encouraged pursed lip breathing and increased to 93 RA. Encouraged more walking today and IS. Pt in recliner. Will f/u. [phone]  Harriet Massonandi Kristan Shaheen Mende CES, ACSM 05/21/2016 9:51 AM

## 2016-05-21 NOTE — Progress Notes (Addendum)
[address]       [place] [address]             [phone]      5 Days Post-Op Procedure(s) (LRB): MINIMALLY INVASIVE MITRAL VALVE REPAIR (MVR) (Right) ATRIAL SEPTAL DEFECT (ASD) REPAIR (N/A) TRANSESOPHAGEAL ECHOCARDIOGRAM (TEE) (N/A) CLIPPING OF ATRIAL APPENDAGE Subjective: Feels okay. Pain is well controlled with medication.   Objective: Vital signs in last 24 hours: Temp:  [97.9 F (36.6 C)-98.4 F (36.9 C)] 98.4 F (36.9 C) (03/12 0607) Pulse Rate:  [60-73] 73 (03/12 0607) Cardiac Rhythm: Heart block (03/11 1928) Resp:  [18-20] 18 (03/12 0607) BP: (115-116)/(70-72) 116/70 (03/12 0607) SpO2:  [93 %-95 %] 93 % (03/12 0607) Weight:  [93.3 kg (205 lb 9.6 oz)] 93.3 kg (205 lb 9.6 oz) (03/12 0607)     Intake/Output from previous day: 03/11 0701 - 03/12 0700 In: 480 [P.O.:480] Out: 2570 [Urine:2250; Chest Tube:320] Intake/Output this shift: No intake/output data recorded.  General appearance: alert, cooperative and no distress Heart: regular rate and rhythm, S1, S2 normal, no murmur, click, rub or gallop Lungs: clear to auscultation bilaterally Abdomen: soft, non-tender; bowel sounds normal; no masses,  no organomegaly Extremities: 1+ non-pitting pedal edema Wound: clean and dry  Lab Results:  Recent Labs  05/19/16 0343 05/20/16 0430  WBC 11.3* 8.4  HGB 9.4* 9.7*  HCT 27.9* 27.5*  PLT 46* 75*   BMET:  Recent Labs  05/20/16 0430 05/21/16 0208  NA 132* 131*  K 3.8 3.5  CL 96* 96*  CO2 25 26  GLUCOSE 102* 106*  BUN 22* 18  CREATININE 1.40* 1.20  CALCIUM 8.5* 8.5*    PT/INR:  Recent Labs  05/21/16 0208  LABPROT 15.4*  INR 1.21   ABG    Component Value Date/Time   PHART 7.364 05/17/2016 0758   HCO3 22.2 05/17/2016 0758   TCO2 23 05/17/2016 1654   ACIDBASEDEF 3.0 (H) 05/17/2016 0758   O2SAT 93.0 05/17/2016 0758   CBG (last 3)   Recent Labs  05/20/16 2143  GLUCAP 103*    Assessment/Plan: S/P Procedure(s)  (LRB): MINIMALLY INVASIVE MITRAL VALVE REPAIR (MVR) (Right) ATRIAL SEPTAL DEFECT (ASD) REPAIR (N/A) TRANSESOPHAGEAL ECHOCARDIOGRAM (TEE) (N/A) CLIPPING OF ATRIAL APPENDAGE  1. CV-NSR with one episode of 2nd degree heart block. Continue to hold beta blocker and Amio. Wires remain in place and are connected to pacer box 2. Pulm-tolerating room air without issue. Continue chest tubes due to output, 250cc/16 hours. CXR this morning showed less than a 5% pneumothorax on the right with a right chest tube in place. 3. Renal-creatinine trending down, today 1.20. Electrolytes okay. Fluid balance negative. Continue Lasix once daily 4. H and H stable with platelet count trending up. HIT pending, off all heparin agents 5. Anticoagulation-Continue coumadin 2.5mg , INR 1.21 this morning  Plan: Continue chest tubes due to output. Mobilize and continue gentle diuresis.  Continue coumadin and monitor INR. Working with PT for increased mobility. Encouraged incentive spirometry.     LOS: 5 days    Sharlene Dory 05/21/2016  I have seen and examined the patient and agree with the assessment and plan as outlined.  Making good progress.  Rhythm and HR has normalized.  D/C pacing wires and chest tubes.  Restart low dose amiodarone.  Increase Coumadin to 5 mg/day.  Tentatively plan D/C home tomorrow and recheck INR on Friday.  Return to our office on Monday 06/04/2016.   Purcell Nails, MD  05/21/2016 8:45am

## 2016-05-22 ENCOUNTER — Inpatient Hospital Stay (HOSPITAL_COMMUNITY): Payer: Commercial Managed Care - PPO

## 2016-05-22 LAB — PROTIME-INR
INR: 1.21
PROTHROMBIN TIME: 15.4 s — AB (ref 11.4–15.2)

## 2016-05-22 MED ORDER — POTASSIUM CHLORIDE CRYS ER 20 MEQ PO TBCR: 20.0000 meq | EXTENDED_RELEASE_TABLET | Freq: Every day | ORAL | 0 refills | Status: DC

## 2016-05-22 MED ORDER — OXYCODONE HCL 5 MG PO TABS: 5.0000 mg | ORAL_TABLET | Freq: Four times a day (QID) | ORAL | 0 refills | Status: DC | PRN

## 2016-05-22 MED ORDER — WARFARIN SODIUM 5 MG PO TABS
5.0000 mg | ORAL_TABLET | Freq: Every day | ORAL | 1 refills | Status: DC
Start: 2016-05-22 — End: 2016-06-05

## 2016-05-22 MED ORDER — AMIODARONE HCL 200 MG PO TABS: 200.0000 mg | ORAL_TABLET | Freq: Every day | ORAL | 1 refills | Status: DC

## 2016-05-22 MED ORDER — FUROSEMIDE 40 MG PO TABS: 40.0000 mg | ORAL_TABLET | Freq: Every day | ORAL | 0 refills | Status: DC

## 2016-05-22 NOTE — Progress Notes (Addendum)
      [address]       [place], [id number]             [phone]      6 Days Post-Op Procedure(s) (LRB): MINIMALLY INVASIVE MITRAL VALVE REPAIR (MVR) (Right) ATRIAL SEPTAL DEFECT (ASD) REPAIR (N/A) TRANSESOPHAGEAL ECHOCARDIOGRAM (TEE) (N/A) CLIPPING OF ATRIAL APPENDAGE Subjective: Feels good this morning.   Objective: Vital signs in last 24 hours: Temp:  [97.7 F (36.5 C)-98.7 F (37.1 C)] 98.7 F (37.1 C) (03/13 0622) Pulse Rate:  [67-75] 73 (03/13 0622) Cardiac Rhythm: Atrial flutter (03/12 1900) Resp:  [18] 18 (03/13 0622) BP: (105-128)/(70-87) 122/73 (03/13 0622) SpO2:  [93 %-98 %] 93 % (03/13 0622) Weight:  [93.1 kg (205 lb 4 oz)] 93.1 kg (205 lb 4 oz) (03/13 0622)     Intake/Output from previous day: 03/12 0701 - 03/13 0700 In: -  Out: 2850 [Urine:2850] Intake/Output this shift: No intake/output data recorded.  General appearance: alert, cooperative and no distress Heart: regular rate and rhythm, S1, S2 normal, no murmur, click, rub or gallop Lungs: clear to auscultation bilaterally Abdomen: soft, non-tender; bowel sounds normal; no masses,  no organomegaly Extremities: 1+ pedal edema Wound: clean and dry  Lab Results:  Recent Labs  05/20/16 0430  WBC 8.4  HGB 9.7*  HCT 27.5*  PLT 75*   BMET:  Recent Labs  05/20/16 0430 05/21/16 0208  NA 132* 131*  K 3.8 3.5  CL 96* 96*  CO2 25 26  GLUCOSE 102* 106*  BUN 22* 18  CREATININE 1.40* 1.20  CALCIUM 8.5* 8.5*    PT/INR:  Recent Labs  05/22/16 0232  LABPROT 15.4*  INR 1.21   ABG    Component Value Date/Time   PHART 7.364 05/17/2016 0758   HCO3 22.2 05/17/2016 0758   TCO2 23 05/17/2016 1654   ACIDBASEDEF 3.0 (H) 05/17/2016 0758   O2SAT 93.0 05/17/2016 0758   CBG (last 3)   Recent Labs  05/20/16 2143  GLUCAP 103*    Assessment/Plan: S/P Procedure(s) (LRB): MINIMALLY INVASIVE MITRAL VALVE REPAIR (MVR) (Right) ATRIAL SEPTAL DEFECT (ASD) REPAIR  (N/A) TRANSESOPHAGEAL ECHOCARDIOGRAM (TEE) (N/A) CLIPPING OF ATRIAL APPENDAGE  1. CV-NSR with one episode of 2nd degree heart block. Continue to hold beta blocker. Restart Amio. EPW discontinued yesterday. CXR showed an increase in his pneumothorax to 10% on the right, discussed with Dr. Cornelius Moraswen 2. Pulm-tolerating room air without issue. Chest tubes discontinued yesterday.  3. Renal-creatinine trending down, today 1.20. Electrolytes okay. Fluid balance negative. Continue Lasix once daily 4. H and H stable with platelet count trending up. HIT pending, off all heparin agents 5. Anticoagulation-Increase Coumadin 5mg , INR 1.21 this morning  Plan: Discharge today.    LOS: 6 days    Sharlene Doryessa N Conte 05/22/2016  I have seen and examined the patient and agree with the assessment and plan as outlined.  D/C home today.  Instructions given.  Purcell Nailslarence H Owen, MD 05/22/2016 10:13 AM

## 2016-05-22 NOTE — Care Management Note (Signed)
Case Management Note Donn PieriniKristi Hinley Brimage RN, BSN Unit 2W-Case Manager [phone]  Patient Details  Name: Trevor JewelJohn E Melton MRN: [id number] Date of Birth: 05/14/1960  Subjective/Objective:  Pt admitted s/p Mini MVR                   Action/Plan: PTA pt lived at home, plan to d/c with assistance provided from sisters- no CM needs noted for discharge  Expected Discharge Date:  05/22/16               Expected Discharge Plan:  Home/Self Care  In-House Referral:     Discharge planning Services  CM Consult  Post Acute Care Choice:  NA Choice offered to:  NA  DME Arranged:    DME Agency:  NA  HH Arranged:  NA HH Agency:  NA  Status of Service:  Completed, signed off  If discussed at Long Length of Stay Meetings, dates discussed:  3/13  Additional Comments:  Darrold SpanWebster, [name], RN 05/22/2016, 12:22 PM

## 2016-05-22 NOTE — Progress Notes (Signed)
Pt doing well this am. Walked last night without RW, sts his breathing felt better without CT in. Ed completed with sister present, encouraging more IS and walking. [place] and encouraged him to f/u with his cardiologist regarding CRPII. Set up Coumadin video. Voiced understanding. [phone] Ethelda ChickKristan Johnte Portnoy CES, ACSM 9:40 AM 05/22/2016

## 2016-05-29 ENCOUNTER — Other Ambulatory Visit: Payer: Self-pay | Admitting: *Deleted

## 2016-05-29 ENCOUNTER — Other Ambulatory Visit: Payer: Self-pay | Admitting: Thoracic Surgery (Cardiothoracic Vascular Surgery)

## 2016-05-29 ENCOUNTER — Other Ambulatory Visit (HOSPITAL_COMMUNITY): Payer: Self-pay | Admitting: Thoracic Surgery (Cardiothoracic Vascular Surgery)

## 2016-05-29 ENCOUNTER — Ambulatory Visit (INDEPENDENT_AMBULATORY_CARE_PROVIDER_SITE_OTHER): Payer: Self-pay | Admitting: Thoracic Surgery (Cardiothoracic Vascular Surgery)

## 2016-05-29 VITALS — BP 141/78 | HR 75 | Resp 16 | Ht 74.0 in | Wt 198.0 lb

## 2016-05-29 DIAGNOSIS — Z9889 Other specified postprocedural states: Secondary | ICD-10-CM

## 2016-05-29 DIAGNOSIS — Z8774 Personal history of (corrected) congenital malformations of heart and circulatory system: Secondary | ICD-10-CM

## 2016-05-29 DIAGNOSIS — Q211 Atrial septal defect, unspecified: Secondary | ICD-10-CM

## 2016-05-29 DIAGNOSIS — I4891 Unspecified atrial fibrillation: Secondary | ICD-10-CM

## 2016-05-29 DIAGNOSIS — I340 Nonrheumatic mitral (valve) insufficiency: Secondary | ICD-10-CM

## 2016-05-29 MED ORDER — ALPRAZOLAM 0.25 MG PO TABS: 0.2500 mg | ORAL_TABLET | Freq: Every evening | ORAL | 0 refills | Status: DC | PRN

## 2016-05-29 NOTE — Progress Notes (Signed)
[address]       [name]             [phone]     CARDIOTHORACIC SURGERY OFFICE NOTE  Referring Provider is Mayford Knife, Bernestine Amass., MD  Primary Cardiologist is McRae III, A. Maisie Fus, MD PCP is Lucretia Field, MD  HPI:  Patient is a 56 year old male who returns to the office today for follow-up status post minimally invasive mitral valve repair, closure of atrial septal defect, and clipping of left atrial appendage on 05/16/2016 for mitral valve prolapse with severe symptomatic primary mitral regurgitation and a small secundum type atrial septal defect.  The patient's early postoperative recovery in the hospital was uncomplicated although notable for some intermittent first and second-degree AV block and atrial tachycardia. His rhythm stabilized and he was ultimately discharged from the hospital on the sixth postoperative day on low-dose amiodarone and warfarin anticoagulation. His INR was checked as an outpatient last week and his Coumadin dose increased. He returns to the office today for chest tube suture removal and wound check. He reports that since hospital discharge his chest wall pain has resolved and he has not been taking any pain medications. His weight has gradually returned to his baseline and all of his peripheral edema has resolved. He reports no shortness of breath and he has been walking a fair amount every day. He does complain of feeling palpitations. He states that he feels as though his heart is skipping beats fairly frequently. He has not had any dizzy spells.  His appetite is good. He has been very anxious and he has not been able to sleep much at all.   Current Outpatient Prescriptions  Medication Sig Dispense Refill  . amiodarone (PACERONE) 200 MG tablet Take 1 tablet (200 mg total) by mouth daily. 30 tablet 1  . fluticasone (FLONASE) 50 MCG/ACT nasal spray Place 1-2 sprays into both nostrils daily as needed for allergies.     .  furosemide (LASIX) 40 MG tablet Take 1 tablet (40 mg total) by mouth daily. 7 tablet 0  . loratadine (CLARITIN) 10 MG tablet Take 10 mg by mouth daily.    . Multiple Vitamin (MULTIVITAMIN WITH MINERALS) TABS tablet Take 1 tablet by mouth daily.    . potassium chloride SA (K-DUR,KLOR-CON) 20 MEQ tablet Take 1 tablet (20 mEq total) by mouth daily. 7 tablet 0  . warfarin (COUMADIN) 5 MG tablet Take 1 tablet (5 mg total) by mouth daily at 6 PM. (Patient taking differently: Take 5 mg by mouth daily at 6 PM. OR AS DIRECTED) 30 tablet 1  . ALPRAZolam (XANAX) 0.25 MG tablet Take 1 tablet (0.25 mg total) by mouth at bedtime as needed for anxiety or sleep. 14 tablet 0  . oxyCODONE (OXY IR/ROXICODONE) 5 MG immediate release tablet Take 1-2 tablets (5-10 mg total) by mouth every 6 (six) hours as needed for severe pain. (Patient not taking: Reported on 05/29/2016) 30 tablet 0   No current facility-administered medications for this visit.       Physical Exam:   BP (!) 141/78 (BP Location: Right Arm, Patient Position: Sitting, Cuff Size: Large)   Pulse 75   Resp 16   Ht 6\' 2"  (1.88 m)   Wt 198 lb (89.8 kg)   SpO2 98% Comment: ON RA  BMI 25.42 kg/m   General:  Well-appearing  Chest:   Clear to auscultation  CV:   Regular rate and rhythm without murmur  Incisions:  Healing nicely  Abdomen:  Soft nontender  Extremities:  Warm and well-perfused, no edema  Diagnostic Tests:  2 channel telemetry rhythm strip demonstrates what appears to be atrial tachycardia with variable AV conduction. Heart rate is in the 70s.   Impression:  Patient is progressing well just 2 weeks status post minimally invasive mitral valve repair, although he does appear to be having atrial dysrhythmias. His rate is well controlled and his blood pressure is stable. He is anticoagulated using warfarin and he is due to have his prothrombin time recheck tomorrow.   Plan:  I have reassured the patient that he seems to be doing  well in the palpitations and atrial dysrhythmias or common following mitral valve repair. We have not recommended any changes in his current medications, other than I have given him prescription for Xanax to use as necessary for anxiety and sleep. The patient will return to our office for follow-up next week as previously scheduled. We will obtain a chest x-ray, 12-lead EKG and routine follow-up echocardiogram prior to his office visit.  All of his questions have been addressed.    Salvatore Decentlarence H. Cornelius Moraswen, MD 05/29/2016 2:57 PM

## 2016-05-29 NOTE — Patient Instructions (Signed)
Continue all previous medications without any changes at this time  You may continue to gradually increase your physical activity as tolerated.  Refrain from any heavy lifting or strenuous use of your arms and shoulders until at least 8 weeks from the time of your surgery, and avoid activities that cause increased pain in your chest on the side of your surgical incision.  Otherwise you may continue to increase activities without any particular limitations.  Increase the intensity and duration of physical activity gradually.  

## 2016-06-01 ENCOUNTER — Other Ambulatory Visit: Payer: Self-pay | Admitting: Thoracic Surgery (Cardiothoracic Vascular Surgery)

## 2016-06-01 DIAGNOSIS — Z9889 Other specified postprocedural states: Secondary | ICD-10-CM

## 2016-06-04 ENCOUNTER — Other Ambulatory Visit: Payer: Self-pay

## 2016-06-04 ENCOUNTER — Ambulatory Visit (HOSPITAL_COMMUNITY)
Admission: RE | Admit: 2016-06-04 | Discharge: 2016-06-04 | Disposition: A | Discharge status: Home / Self Care | Payer: Commercial Managed Care - PPO | Source: Ambulatory Visit | Attending: Thoracic Surgery (Cardiothoracic Vascular Surgery) | Admitting: Thoracic Surgery (Cardiothoracic Vascular Surgery)

## 2016-06-04 ENCOUNTER — Ambulatory Visit: Payer: Commercial Managed Care - PPO

## 2016-06-04 DIAGNOSIS — I272 Pulmonary hypertension, unspecified: Secondary | ICD-10-CM | POA: Insufficient documentation

## 2016-06-04 DIAGNOSIS — Z9889 Other specified postprocedural states: Secondary | ICD-10-CM | POA: Diagnosis present

## 2016-06-04 DIAGNOSIS — Z8774 Personal history of (corrected) congenital malformations of heart and circulatory system: Secondary | ICD-10-CM

## 2016-06-04 NOTE — Progress Notes (Signed)
  Echocardiogram 2D Echocardiogram has been performed.  Nolon RodBrown, Tony 06/04/2016, 11:51 AM

## 2016-06-05 ENCOUNTER — Encounter: Payer: Self-pay | Admitting: Thoracic Surgery (Cardiothoracic Vascular Surgery)

## 2016-06-05 ENCOUNTER — Other Ambulatory Visit: Payer: Self-pay | Admitting: *Deleted

## 2016-06-05 ENCOUNTER — Ambulatory Visit
Admission: RE | Admit: 2016-06-05 | Discharge: 2016-06-05 | Disposition: A | Discharge status: Home / Self Care | Payer: Commercial Managed Care - PPO | Source: Ambulatory Visit | Attending: Thoracic Surgery (Cardiothoracic Vascular Surgery) | Admitting: Thoracic Surgery (Cardiothoracic Vascular Surgery)

## 2016-06-05 ENCOUNTER — Ambulatory Visit (INDEPENDENT_AMBULATORY_CARE_PROVIDER_SITE_OTHER): Payer: Self-pay | Admitting: Thoracic Surgery (Cardiothoracic Vascular Surgery)

## 2016-06-05 VITALS — BP 143/86 | HR 79 | Resp 20 | Ht 74.0 in | Wt 198.0 lb

## 2016-06-05 DIAGNOSIS — Q211 Atrial septal defect, unspecified: Secondary | ICD-10-CM

## 2016-06-05 DIAGNOSIS — I340 Nonrheumatic mitral (valve) insufficiency: Secondary | ICD-10-CM

## 2016-06-05 DIAGNOSIS — Z8774 Personal history of (corrected) congenital malformations of heart and circulatory system: Secondary | ICD-10-CM

## 2016-06-05 DIAGNOSIS — Z9889 Other specified postprocedural states: Secondary | ICD-10-CM

## 2016-06-05 DIAGNOSIS — R011 Cardiac murmur, unspecified: Secondary | ICD-10-CM

## 2016-06-05 MED ORDER — WARFARIN SODIUM 5 MG PO TABS: 5.0000 mg | ORAL_TABLET | Freq: Every day | ORAL | 1 refills | Status: DC

## 2016-06-05 MED ORDER — LISINOPRIL 10 MG PO TABS: 10.0000 mg | ORAL_TABLET | Freq: Every day | ORAL | 1 refills | Status: DC

## 2016-06-05 MED ORDER — WARFARIN SODIUM 5 MG PO TABS: 5.0000 mg | ORAL_TABLET | Freq: Every day | ORAL | 1 refills | Status: AC

## 2016-06-05 NOTE — Patient Instructions (Addendum)
Stop taking amiodarone.    You may continue to gradually increase your physical activity as tolerated.  Refrain from any heavy lifting or strenuous use of your arms and shoulders until at least 8 weeks from the time of your surgery, and avoid activities that cause increased pain in your chest on the side of your surgical incision.  Otherwise you may continue to increase activities without any particular limitations.  Increase the intensity and duration of physical activity gradually.  You are encouraged to enroll and participate in the outpatient cardiac rehab program beginning as soon as practical.  We typically plan on stopping Coumadin 3 months after surgery as long as your heart is in rhythm.  Discuss with your cardiologist.  Begin taking aspirin 81 mg daily when you stop taking Coumadin  Endocarditis is a potentially serious infection of heart valves or inside lining of the heart.  It occurs more commonly in patients with diseased heart valves (such as patient's with aortic or mitral valve disease) and in patients who have undergone heart valve repair or replacement.  Certain surgical and dental procedures may put you at risk, such as dental cleaning, other dental procedures, or any surgery involving the respiratory, urinary, gastrointestinal tract, gallbladder or prostate gland.   To minimize your chances for develooping endocarditis, maintain good oral health and seek prompt medical attention for any infections involving the mouth, teeth, gums, skin or urinary tract.    Always notify your doctor or dentist about your underlying heart valve condition before having any invasive procedures. You will need to take antibiotics before certain procedures, including all routine dental cleanings or other dental procedures.  Your cardiologist or dentist should prescribe these antibiotics for you to be taken ahead of time.

## 2016-06-05 NOTE — Progress Notes (Signed)
[address]       [place] [address]             [phone]     CARDIOTHORACIC SURGERY OFFICE NOTE  Referring Provider is Mayford Knife, Bernestine Amass., MD Primary Cardiologist is McRae III, A. Maisie Fus, MD PCP is Lucretia Field, MD  HPI:  Patient returns to the office today for further follow-up status post minimally invasive mitral valve repair, closure of atrial septal defect, and clipping of left atrial appendage on 05/16/2016 for mitral valve prolapse with severe symptomatically primary mitral regurgitation and a small secundum type atrial septal defect. He was last seen here in our office on 05/29/2016 at which time he was doing quite well although complaining of frequent palpitations without shortness of breath or dizziness.  At that time he appeared to be in atrial tachycardia with variable AV conduction with heart rate in the 70s. He states that since then he has done quite well. He feels much better and the palpitations have essentially resolved. He no longer has significant pain in his chest. He is sleeping much better. He has no shortness of breath.   Current Outpatient Prescriptions  Medication Sig Dispense Refill  . ALPRAZolam (XANAX) 0.25 MG tablet Take 1 tablet (0.25 mg total) by mouth at bedtime as needed for anxiety or sleep. 14 tablet 0  . amiodarone (PACERONE) 200 MG tablet Take 1 tablet (200 mg total) by mouth daily. 30 tablet 1  . fluticasone (FLONASE) 50 MCG/ACT nasal spray Place 1-2 sprays into both nostrils daily as needed for allergies.     . furosemide (LASIX) 40 MG tablet Take 1 tablet (40 mg total) by mouth daily. 7 tablet 0  . loratadine (CLARITIN) 10 MG tablet Take 10 mg by mouth daily.    . Multiple Vitamin (MULTIVITAMIN WITH MINERALS) TABS tablet Take 1 tablet by mouth daily.    Marland Kitchen oxyCODONE (OXY IR/ROXICODONE) 5 MG immediate release tablet Take 1-2 tablets (5-10 mg total) by mouth every 6 (six) hours as needed for severe pain. (Patient not  taking: Reported on 05/29/2016) 30 tablet 0  . potassium chloride SA (K-DUR,KLOR-CON) 20 MEQ tablet Take 1 tablet (20 mEq total) by mouth daily. 7 tablet 0  . warfarin (COUMADIN) 5 MG tablet Take 1 tablet (5 mg total) by mouth daily at 6 PM. OR AS DIRECTED 60 tablet 1   No current facility-administered medications for this visit.       Physical Exam:   BP (!) 143/86   Pulse 79   Resp 20   Ht 6\' 2"  (1.88 m)   Wt 198 lb (89.8 kg)   BMI 25.42 kg/m   General:  Well-appearing  Chest:   Clear to auscultation  CV:   Regular rate and rhythm with soft systolic murmur heard best at the apex  Incisions:  Healing nicely  Abdomen:  Soft nontender  Extremities:  Warm and well-perfused, no edema  Diagnostic Tests:  CHEST  2 VIEW  COMPARISON:  05/22/2016 and earlier.  FINDINGS: Mediastinal contour and postoperative changes appear stable. Small left pleural effusion has nearly resolved. Small right apical pneumothorax has resolved. Small right pleural effusion persists. There is decreased but not resolved streaky and interstitial opacity in the right mid and lower lung. No pneumothorax or pulmonary edema. No acute osseous abnormality identified. Negative visible bowel gas pattern. No pneumoperitoneum.  IMPRESSION: 1. Resolved right pneumothorax, and nearly resolved small left pleural effusion. 2. Persistent small right pleural effusion  and patchy right lung base opacity, but improved right lung base ventilation since 05/22/2016.   Electronically Signed   By: Odessa Fleming M.D.   On: 06/05/2016 12:28   Transthoracic Echocardiography  Patient:    Trevor Melton, Trevor Melton MR #:       [id number] Study Date: 06/04/2016 Gender:     M Age:        56 Height:     188 cm Weight:     89.8 kg BSA:        2.17 m^2 Pt. Status: Room:   ATTENDING    Tressie Stalker, M.D.  ORDERING     Tressie Stalker, M.D.  REFERRING    Tressie Stalker, M.D.  SONOGRAPHER  Nolon Rod, RDCS  PERFORMING   [facility],  Outpatient  cc:  ------------------------------------------------------------------- LV EF: 45%  ------------------------------------------------------------------- Indications:      Mitral stenosis [non-rheumatic] 424.0.  ------------------------------------------------------------------- History:   PMH:   Mitral valve disease.  ------------------------------------------------------------------- Study Conclusions  - Left ventricle: The cavity size was normal. Wall thickness was   normal. Mild diffuse hypokinesis. Indeterminant diastolic   function. The estimated ejection fraction was 45%. - Ventricular septum: D-shaped interventricular septum suggestive   of RV pressure/volume overload. - Aortic valve: There was no stenosis. - Aorta: Borderline dilated aortic root. Aortic root dimension: 37   mm (ED). - Mitral valve: Status post mitral valve repair. There was no   evidence for stenosis. There was trivial regurgitation. Mean   gradient (D): 4 mm Hg. Valve area by pressure half-time: 2.42   cm^2. - Left atrium: The atrium was moderately to severely dilated. - Right ventricle: The cavity size was moderately dilated. Systolic   function was mildly reduced. - Right atrium: The atrium was moderately dilated. - Tricuspid valve: Peak RV-RA gradient (S): 25 mm Hg. - Pulmonary arteries: PA peak pressure: 40 mm Hg (S). - Systemic veins: IVC measured 3.2 cm with < 50% respirophasic   variation, suggesting RA pressure 15 mmHg.  Impressions:  - Normal LV size with mild diffuse hypokinesis, EF 45%. D-shaped   interventricular septum suggesting RV pressure/volume overload.   Moderately dilated RV with mildly decreased systolic function.   Mild pulmonary hypertension. Biatrial enlargement. S/p mitral   valve repair with no stenosis, trivial MR.  ------------------------------------------------------------------- Labs, prior tests, procedures, and surgery: Valve surgery  (05/16/2016).     Mitral valve replacement.  ------------------------------------------------------------------- Study data:  Comparison was made to the study of 05/16/2016.  Study status:  Routine.  Study completion:  There were no complications.         Transthoracic echocardiography.  M-mode, complete 2D, spectral Doppler, and color Doppler.  Birthdate:  Patient birthdate: 26-Jun-1960.  Age:  Patient is 56 yr old.  Sex:  Gender: male.    BMI: 25.4 kg/m^2.  Blood pressure:     141/78  Patient status:  Outpatient.  Study date:  Study date: 06/04/2016. Study time: 10:59 AM.  Location:  [facility].  -------------------------------------------------------------------  ------------------------------------------------------------------- Left ventricle:  The cavity size was normal. Wall thickness was normal. Mild diffuse hypokinesis. Indeterminant diastolic function. The estimated ejection fraction was 45%.  ------------------------------------------------------------------- Aortic valve:   Trileaflet.  Doppler:   There was no stenosis. There was no regurgitation.  ------------------------------------------------------------------- Aorta:  Borderline dilated aortic root.  ------------------------------------------------------------------- Mitral valve:  Status post mitral valve repair.  Doppler:   There was no evidence for stenosis.   There was trivial regurgitation. Valve area by pressure half-time: 2.42 cm^2. Indexed valve  area by pressure half-time: 1.11 cm^2/m^2. Valve area by continuity equation (using LVOT flow): 1.08 cm^2. Indexed valve area by continuity equation (using LVOT flow): 0.5 cm^2/m^2.    Mean gradient (D): 4 mm Hg. Peak gradient (D): 17 mm Hg.  ------------------------------------------------------------------- Left atrium:  The atrium was moderately to severely dilated.   ------------------------------------------------------------------- Right  ventricle:  The cavity size was moderately dilated. Systolic function was mildly reduced.  ------------------------------------------------------------------- Ventricular septum:   D-shaped interventricular septum suggestive of RV pressure/volume overload.  ------------------------------------------------------------------- Pulmonic valve:    Structurally normal valve.   Cusp separation was normal.  Doppler:  Transvalvular velocity was within the normal range. There was no regurgitation.  ------------------------------------------------------------------- Tricuspid valve:   Doppler:  There was mild regurgitation.  ------------------------------------------------------------------- Right atrium:  The atrium was moderately dilated.  ------------------------------------------------------------------- Pericardium:  There was no pericardial effusion.  ------------------------------------------------------------------- Systemic veins:  IVC measured 3.2 cm with < 50% respirophasic variation, suggesting RA pressure 15 mmHg.  ------------------------------------------------------------------- Post procedure conclusions Ascending Aorta:  - Borderline dilated aortic root.  ------------------------------------------------------------------- Measurements   Left ventricle                           Value          Reference  LV ID, ED, PLAX chordal           (H)    53.4  mm       43 - 52  LV ID, ES, PLAX chordal                  38    mm       23 - 38  LV fx shortening, PLAX chordal           29    %        >=29  LV PW thickness, ED                      12    mm       ----------  IVS/LV PW ratio, ED                      0.83           <=1.3  Stroke volume, 2D                        86    ml       ----------  Stroke volume/bsa, 2D                    40    ml/m^2   ----------  LV ejection fraction, 1-p A4C            56    %        ----------  LV end-diastolic volume, 2-p              110   ml       ----------  LV end-systolic volume, 2-p              65    ml       ----------  LV ejection fraction, 2-p                41    %        ----------  Stroke volume, 2-p  45    ml       ----------  LV end-diastolic volume/bsa, 2-p         51    ml/m^2   ----------  LV end-systolic volume/bsa, 2-p          30    ml/m^2   ----------  Stroke volume/bsa, 2-p                   20.9  ml/m^2   ----------  Longitudinal strain, TDI                 18    %        ----------    Ventricular septum                       Value          Reference  IVS thickness, ED                        10    mm       ----------    LVOT                                     Value          Reference  LVOT ID, S                               22    mm       ----------  LVOT area                                3.8   cm^2     ----------  LVOT peak velocity, S                    97.1  cm/s     ----------  LVOT mean velocity, S                    71    cm/s     ----------  LVOT VTI, S                              22.6  cm       ----------    Aorta                                    Value          Reference  Aortic root ID, ED                       37    mm       ----------    Left atrium                              Value          Reference  LA ID, A-P, ES  49    mm       ----------  LA ID/bsa, A-P                    (H)    2.26  cm/m^2   <=2.2  LA volume, S                             119   ml       ----------  LA volume/bsa, S                         54.8  ml/m^2   ----------  LA volume, ES, 1-p A4C                   129   ml       ----------  LA volume/bsa, ES, 1-p A4C               59.4  ml/m^2   ----------  LA volume, ES, 1-p A2C                   107   ml       ----------  LA volume/bsa, ES, 1-p A2C               49.3  ml/m^2   ----------    Mitral valve                             Value          Reference  Mitral E-wave peak velocity              206   cm/s      ----------  Mitral A-wave peak velocity              82.4  cm/s     ----------  Mitral mean velocity, D                  80.2  cm/s     ----------  Mitral deceleration time          (H)    285   ms       150 - 230  Mitral pressure half-time                87    ms       ----------  Mitral mean gradient, D                  4     mm Hg    ----------  Mitral peak gradient, D                  17    mm Hg    ----------  Mitral E/A ratio, peak                   2.5            ----------  Mitral valve area, PHT, DP               2.42  cm^2     ----------  Mitral valve area/bsa, PHT, DP           1.11  cm^2/m^2 ----------  Mitral valve area, LVOT  1.08  cm^2     ----------  continuity  Mitral valve area/bsa, LVOT              0.5   cm^2/m^2 ----------  continuity  Mitral annulus VTI, D                    79.2  cm       ----------    Pulmonary arteries                       Value          Reference  PA pressure, S, DP                (H)    40    mm Hg    <=30    Tricuspid valve                          Value          Reference  Tricuspid regurg peak velocity           248   cm/s     ----------  Tricuspid peak RV-RA gradient            25    mm Hg    ----------    Right atrium                             Value          Reference  RA ID, S-I, ES, A4C               (H)    50.1  mm       34 - 49  RA area, ES, A4C                  (H)    20.1  cm^2     8.3 - 19.5  RA volume, ES, A/L                       67    ml       ----------  RA volume/bsa, ES, A/L                   30.8  ml/m^2   ----------    Systemic veins                           Value          Reference  Estimated CVP                            15    mm Hg    ----------    Right ventricle                          Value          Reference  RV s&', lateral, S                        11.6  cm/s     ----------  Legend: (L)  and  (H)  mark values outside specified reference  range.  ------------------------------------------------------------------- Prepared and Electronically Authenticated by  Marca Ancona,  M.D. 2018-03-26T17:23:25   EKG: NSR w/out acute ischemic changes, 2nd degree AV block   Impression:  Patient is doing well approximately 3 weeks following minimally invasive mitral valve repair with closure of atrial septal defect and clipping of left atrial appendage. Follow-up echocardiogram looks good with intact mitral valve repair and only trivial residual mitral regurgitation. Left ventricular ejection fraction has dropped to 45% postoperatively, down from 55-60% preoperatively. This is not unusual in patients with long-standing severe mitral regurgitation and clinically the patient is doing well. The patient does have second-degree AV block with variable conduction. He had some of this immediately after surgery while he was in the hospital but it resolved. He was discharged on low-dose amiodarone without a beta blocker. Last week he was complaining of palpitations which have resolved. Follow-up 12 electricardiogram demonstrates that the second-degree AV block has recurred. In addition, blood pressure has been gradually creeping up since hospital discharge.    Plan:  I have instructed the patient to stop taking amiodarone. We will also leave him off of any beta blockers at this time. I have given him a prescription for lisinopril 10 mg daily. This may need to be titrated up and it will certainly need to be followed up with attention to rechecking his renal function in 4-6 weeks. We will plan to recheck his prothrombin time and INR next week to make sure that his Coumadin dose doesn't need to be increased since amiodarone has been stopped. We typically plan to continue the patient on Coumadin for approximately 3 months following surgery, at which time it can be stopped in the absence of ongoing atrial dysrhythmias.  When he stops taking Coumadin I would  recommend starting low-dose aspirin. The patient plans to return to [place] next week. I think this is reasonable and if his heart rhythm remains stable or improves.  He can resume driving an automobile at that time.  We will plan to recheck another 12-lead EKG next week prior to his departure. I have encouraged the patient to continue to gradually increase his physical activity as tolerated. I've encouraged him to consider enrolling in a formal cardiac rehabilitation program when he gets back to [place]. Finally, the patient has been reminded regarding the importance of dental hygiene and the lifelong need for antibiotic prophylaxis for all dental cleanings and other related invasive procedures.  All of his questions been addressed.  The patient will return to our office for routine follow-up in approximately one year. This appointment can be adjusted to some degree depending upon when he might be returning to [facility] for personal reasons. He will call and return sooner should specific problems or questions arise.   Salvatore Decent. Cornelius Moras, MD 06/05/2016 12:56 PM

## 2016-06-13 ENCOUNTER — Ambulatory Visit (HOSPITAL_COMMUNITY)
Admission: RE | Admit: 2016-06-13 | Discharge: 2016-06-13 | Disposition: A | Discharge status: Home / Self Care | Payer: Commercial Managed Care - PPO | Source: Ambulatory Visit | Attending: Thoracic Surgery (Cardiothoracic Vascular Surgery) | Admitting: Thoracic Surgery (Cardiothoracic Vascular Surgery)

## 2016-06-13 DIAGNOSIS — R9431 Abnormal electrocardiogram [ECG] [EKG]: Secondary | ICD-10-CM | POA: Diagnosis not present

## 2016-06-13 DIAGNOSIS — R011 Cardiac murmur, unspecified: Secondary | ICD-10-CM | POA: Diagnosis present

## 2016-07-27 ENCOUNTER — Telehealth: Payer: Self-pay

## 2016-07-27 NOTE — Telephone Encounter (Signed)
Trevor AriasJohn Melton was last seen on 06/05/16 by Dr Cornelius Moraswen for post op exam. He is scheduled to be seen again next year on 06/10/2017 for routine surgical follow up. Mr [place] is [place] in [place] and under the care of his Cardiologist Dr Landry Mellowhomas McRae for heart care. He has not had any recent hospitalizations.

## 2016-09-06 ENCOUNTER — Encounter: Payer: Self-pay | Admitting: Thoracic Surgery (Cardiothoracic Vascular Surgery)

## 2016-09-19 ENCOUNTER — Encounter: Payer: Self-pay | Admitting: Thoracic Surgery (Cardiothoracic Vascular Surgery)

## 2016-12-03 ENCOUNTER — Encounter: Payer: Self-pay | Admitting: Thoracic Surgery (Cardiothoracic Vascular Surgery)

## 2016-12-03 ENCOUNTER — Ambulatory Visit (INDEPENDENT_AMBULATORY_CARE_PROVIDER_SITE_OTHER): Payer: Commercial Managed Care - PPO | Admitting: Thoracic Surgery (Cardiothoracic Vascular Surgery)

## 2016-12-03 VITALS — BP 132/82 | HR 65 | Resp 16 | Ht 74.0 in | Wt 200.0 lb

## 2016-12-03 DIAGNOSIS — Z9889 Other specified postprocedural states: Secondary | ICD-10-CM

## 2016-12-03 DIAGNOSIS — Z8774 Personal history of (corrected) congenital malformations of heart and circulatory system: Secondary | ICD-10-CM

## 2016-12-03 NOTE — Progress Notes (Signed)
[address]       [place] [address]             [phone]     CARDIOTHORACIC SURGERY OFFICE NOTE  Referring Provider is Mayford Knife, Bernestine Amass., MD Primary Cardiologist is McRae III, A. Maisie Fus, MD PCP is Lucretia Field, MD  HPI:  Patient is a 56 year old male with history of mitral valve prolapse and mitral regurgitation who returns to the office today for routine follow-up status post minimally invasive mitral valve repair, closure of atrial septal defect, and clipping of left atrial appendage on 05/16/2016. His postoperative recovery was uncomplicated and he was last seen here in our office on 06/05/2016 at which time he was recovering uneventfully.  Shortly after that he return to [place] where he lives. He continues to do very well for the next few months, but in June and early July he suffered several brief episodes of transient numbness and weakness involving the right side of his body. He was hospitalized where an MRI revealed a tiny stroke. At the time he was reportedly therapeutic on warfarin. TEE performed at that time revealed intact mitral valve repair and no obvious source for thrombus. He was eventually diagnosed with paroxysmal atrial fibrillation.  He remains anticoagulated using warfarin and he is now on sotalol. He reportedly has been doing well for the past 6 weeks. He returns for office today for routine follow-up.   Current Outpatient Prescriptions  Medication Sig Dispense Refill  . aspirin EC 81 MG tablet Take 81 mg by mouth daily.    . fluticasone (FLONASE) 50 MCG/ACT nasal spray Place 1-2 sprays into both nostrils daily as needed for allergies.     Marland Kitchen loratadine (CLARITIN) 10 MG tablet Take 10 mg by mouth daily.    . magnesium oxide (MAG-OX) 400 MG tablet Take 400 mg by mouth 2 (two) times daily.    . Multiple Vitamin (MULTIVITAMIN WITH MINERALS) TABS tablet Take 1 tablet by mouth daily.    . Multiple Vitamins-Minerals (MULTIVITAMIN ADULT PO)  Take 1 tablet by mouth daily.    . Omega-3 Fatty Acids (FISH OIL) 1000 MG CAPS Take 1 capsule by mouth daily.    . sotalol (BETAPACE) 80 MG tablet Take 80 mg by mouth 2 (two) times daily.    Marland Kitchen warfarin (COUMADIN) 5 MG tablet Take 1 tablet (5 mg total) by mouth daily at 6 PM. OR AS DIRECTED 60 tablet 1   No current facility-administered medications for this visit.       Physical Exam:   BP 132/82 (BP Location: Right Arm, Patient Position: Sitting, Cuff Size: Large)   Pulse 65   Resp 16   Ht  (1.88 m)   Wt 200 lb (90.7 kg)   SpO2 99% Comment: ON RA  BMI 25.68 kg/m   General:  Well-appearing  Chest:   Clear to auscultation  CV:   Regular rate and rhythm with soft systolic murmur heard along the sternal border  Incisions:  Completely healed  Abdomen:  Soft nontender  Extremities:  Warm and well-perfused  Diagnostic Tests:  Images from TEE performed 09/20/2016 are reviewed. This demonstrates mild global left ventricular systolic dysfunction with normal left ventricular size. Mitral valve repair appears intact with mild residual mitral regurgitation and no significant mitral stenosis. No other significant abnormalities are noted.   Impression:  Patient is currently doing well more than 6 months status post minimally invasive mitral valve repair, closure of secundum type  atrial septal defect, and clipping of left atrial appendage. 2 months ago he was diagnosed with recurrent paroxysmal atrial fibrillation and a very small embolic stroke. Neurologic symptoms resolved quickly. He remains anticoagulated on warfarin at this time. Rhythm has been under control on sotalol.    Plan:  We have not recommended any changes to the patient's current medications. Now that he is more than 3 months out from his surgery it would be reasonable to consider switching anticoagulation to a DOAC if desired, but overall continued use of warfarin would probably make most sense. If the patient continues  to have problems with symptomatic atrial fibrillation that fails medical therapy, catheter-based ablation could be considered.  The patient has been reminded regarding the importance of dental hygiene and the lifelong need for antibiotic prophylaxis for all dental cleanings and other related invasive procedures.  He will continue to follow-up with his primary cardiologist in [place].  In the future he will call and return to see [place] only should specific problems or questions arise.   I spent in excess of 15 minutes during the conduct of this office consultation and >50% of this time involved direct face-to-face encounter with the patient for counseling and/or coordination of their care.  Level 2                 10 minutes Level 3                 15 minutes Level 4                 25 minutes Level 5                 40 minutes  Clarence H. Cornelius Moras, MD 12/03/2016 2:11 PM

## 2016-12-03 NOTE — Patient Instructions (Signed)

## 2017-06-10 ENCOUNTER — Ambulatory Visit: Payer: Commercial Managed Care - PPO | Admitting: Thoracic Surgery (Cardiothoracic Vascular Surgery)

## 2017-10-30 IMAGING — CR DG CHEST 1V PORT
1 series · 1 of 1 positions shown · non-contrast
Comparison: PA and lateral chest x-ray May 14, 2016

CLINICAL DATA: Status post minimally invasive mitral valve repair.

EXAM:
PORTABLE CHEST 1 VIEW

[AP]
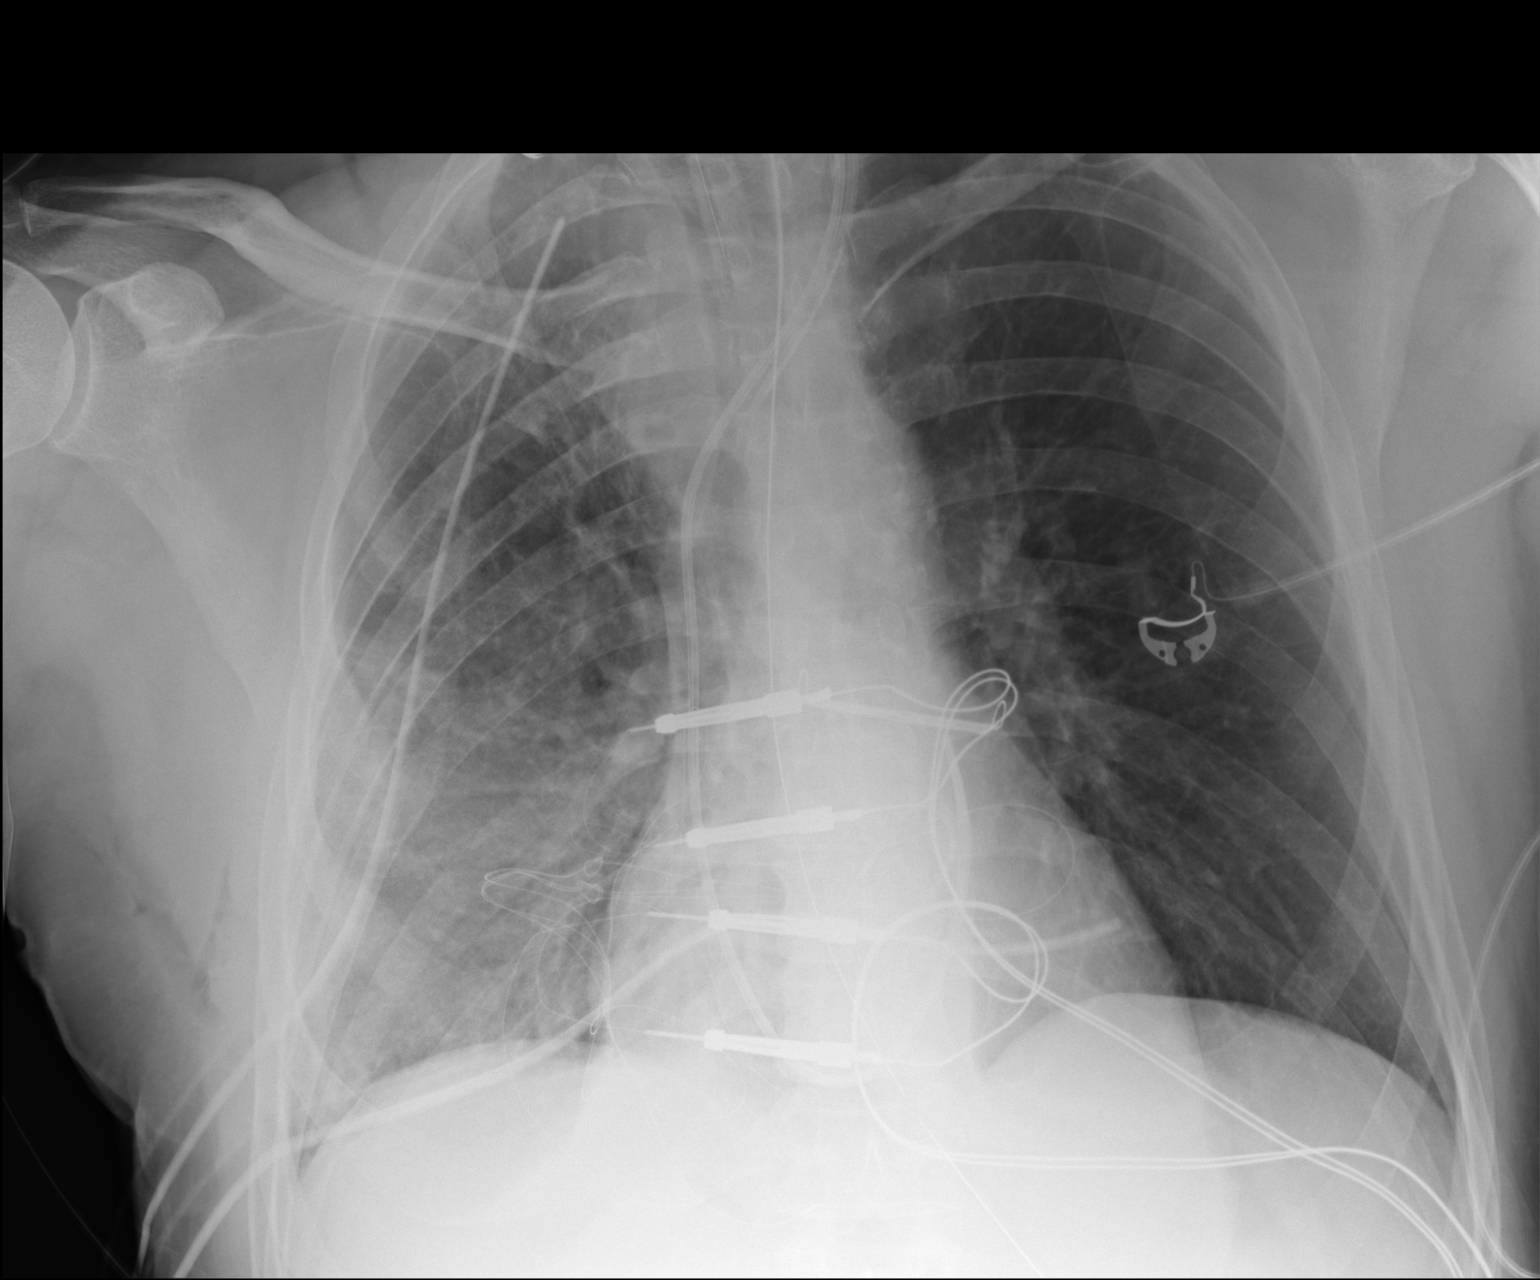

[1 of 1 positions shown; findings below may reference images not displayed]

FINDINGS: There is mild volume loss on the right. The interstitial markings
are increased within the right lung. There is a right upper chest
tube projecting over the apex. There is a lower left chest tube
projecting over the lower left heart border. There is no
pneumothorax or significantpleural effusion. The left lung is
well-expanded and clear. The heart and pulmonary vascularity are
normal. There is calcification in the wall of the aortic arch. The
endotracheal tube tip lies 6.6 cm above the carina. The
esophagogastric tube has its proximal port just above the GE
junction. The Swan-Ganz catheter tip projects in the region of the
proximal pulmonary outflow tract. There is a left internal jugular
venous catheter whose tip projects at the junction of the right and
left brachiocephalic veins.
IMPRESSION: Postoperative subsegmental atelectasis in the right lung. No
pneumothorax or significant pleural effusion. No CHF.

The tip of the Swan-Ganz catheter appears to lie at the proximal
pulmonary outflow tract. Correlation as to the adequacy of this
positioning is needed.

Advancement of the esophagogastric tube by 10 cm is recommended to
assure that the proximal port lies below the GE junction.

## 2017-10-31 IMAGING — CR DG CHEST 1V PORT
1 series · 1 of 1 positions shown · non-contrast
Comparison: 05/16/16

CLINICAL DATA: Status post mitral valve replacement

EXAM:
PORTABLE CHEST 1 VIEW

[AP]
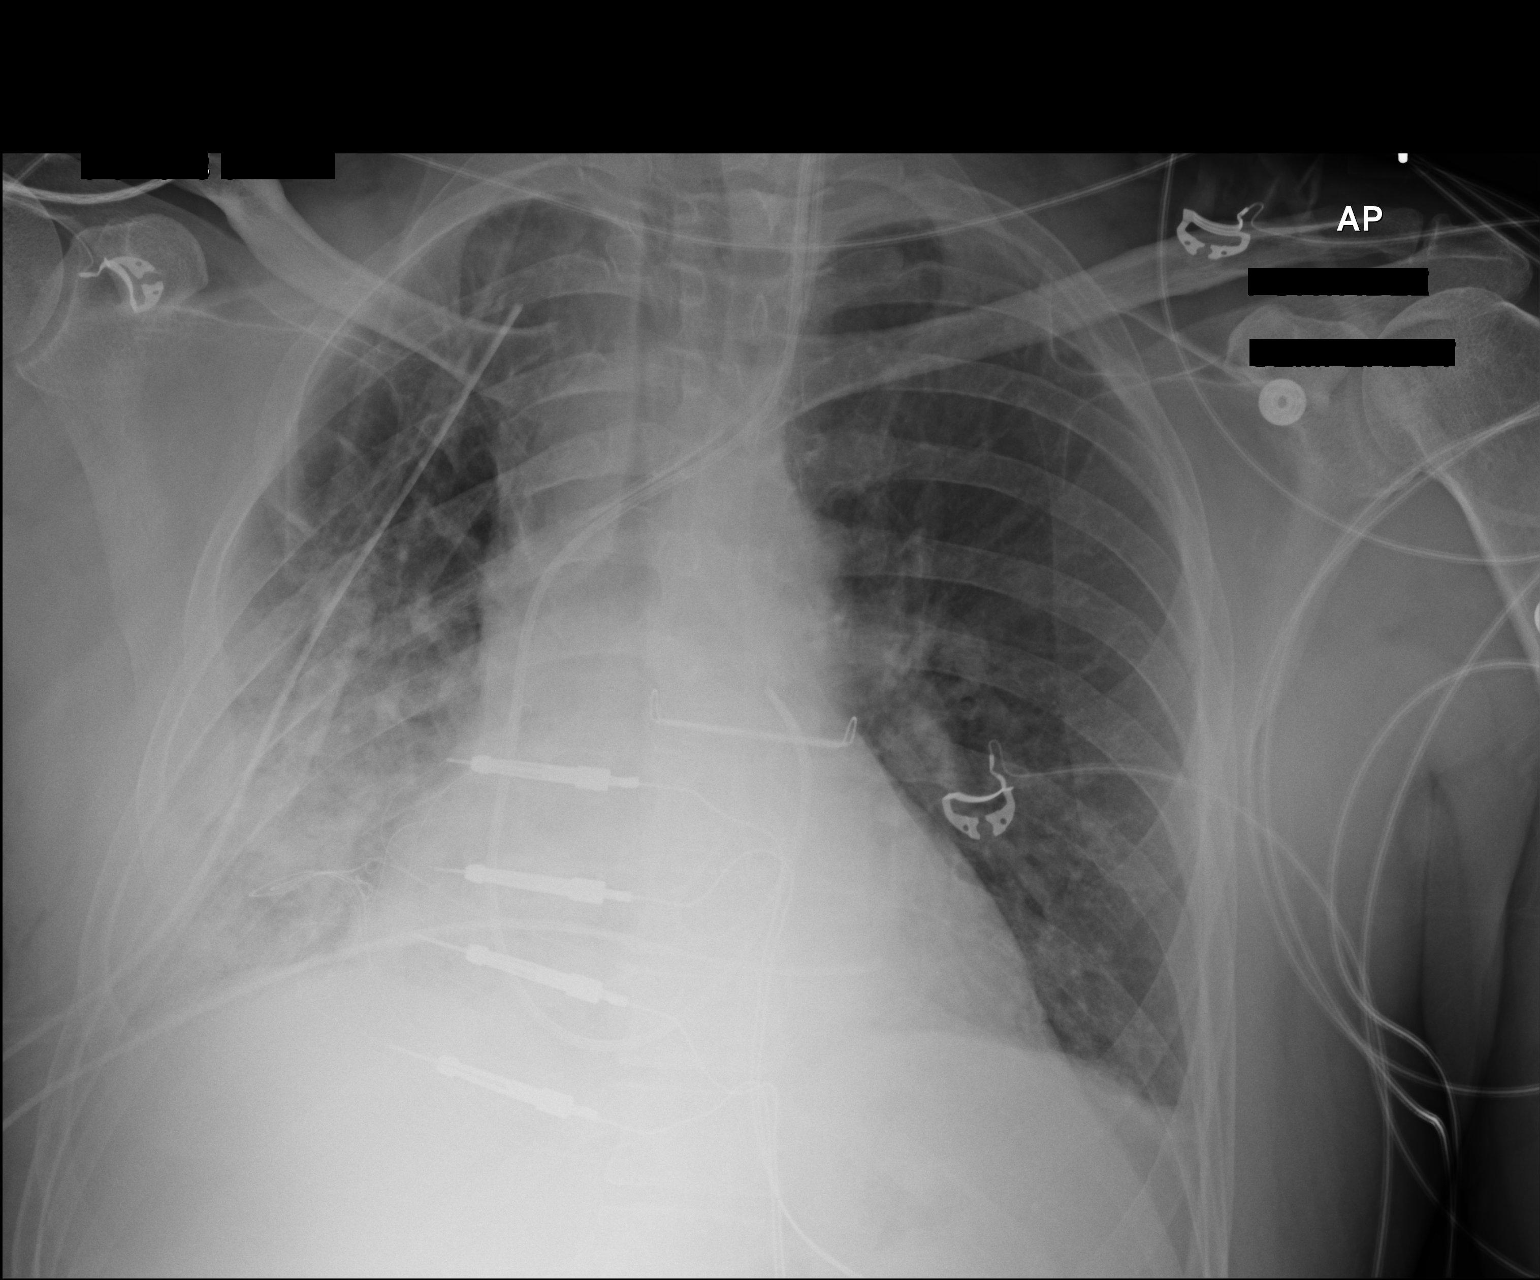

[1 of 1 positions shown; findings below may reference images not displayed]

FINDINGS: Endotracheal tube and nasogastric catheter have been removed in the
interval. A Swan-Ganz catheter and left central venous line are
again seen and stable. Right-sided chest tubes are again noted and
stable. No pneumothorax is seen. Postsurgical changes are again
seen. Evolving infiltrate is noted in the right lung base. The left
lung is clear with the exception of mild atelectasis in the base.
IMPRESSION: Postsurgical change.

Involving right basilar infiltrate.

No pneumothorax is seen.

## 2017-11-01 IMAGING — CR DG CHEST 1V PORT
1 series · 1 of 1 positions shown · non-contrast
Comparison: 05/17/2016

CLINICAL DATA: Chest tube present,,ATX

EXAM:
PORTABLE CHEST 1 VIEW

[AP]
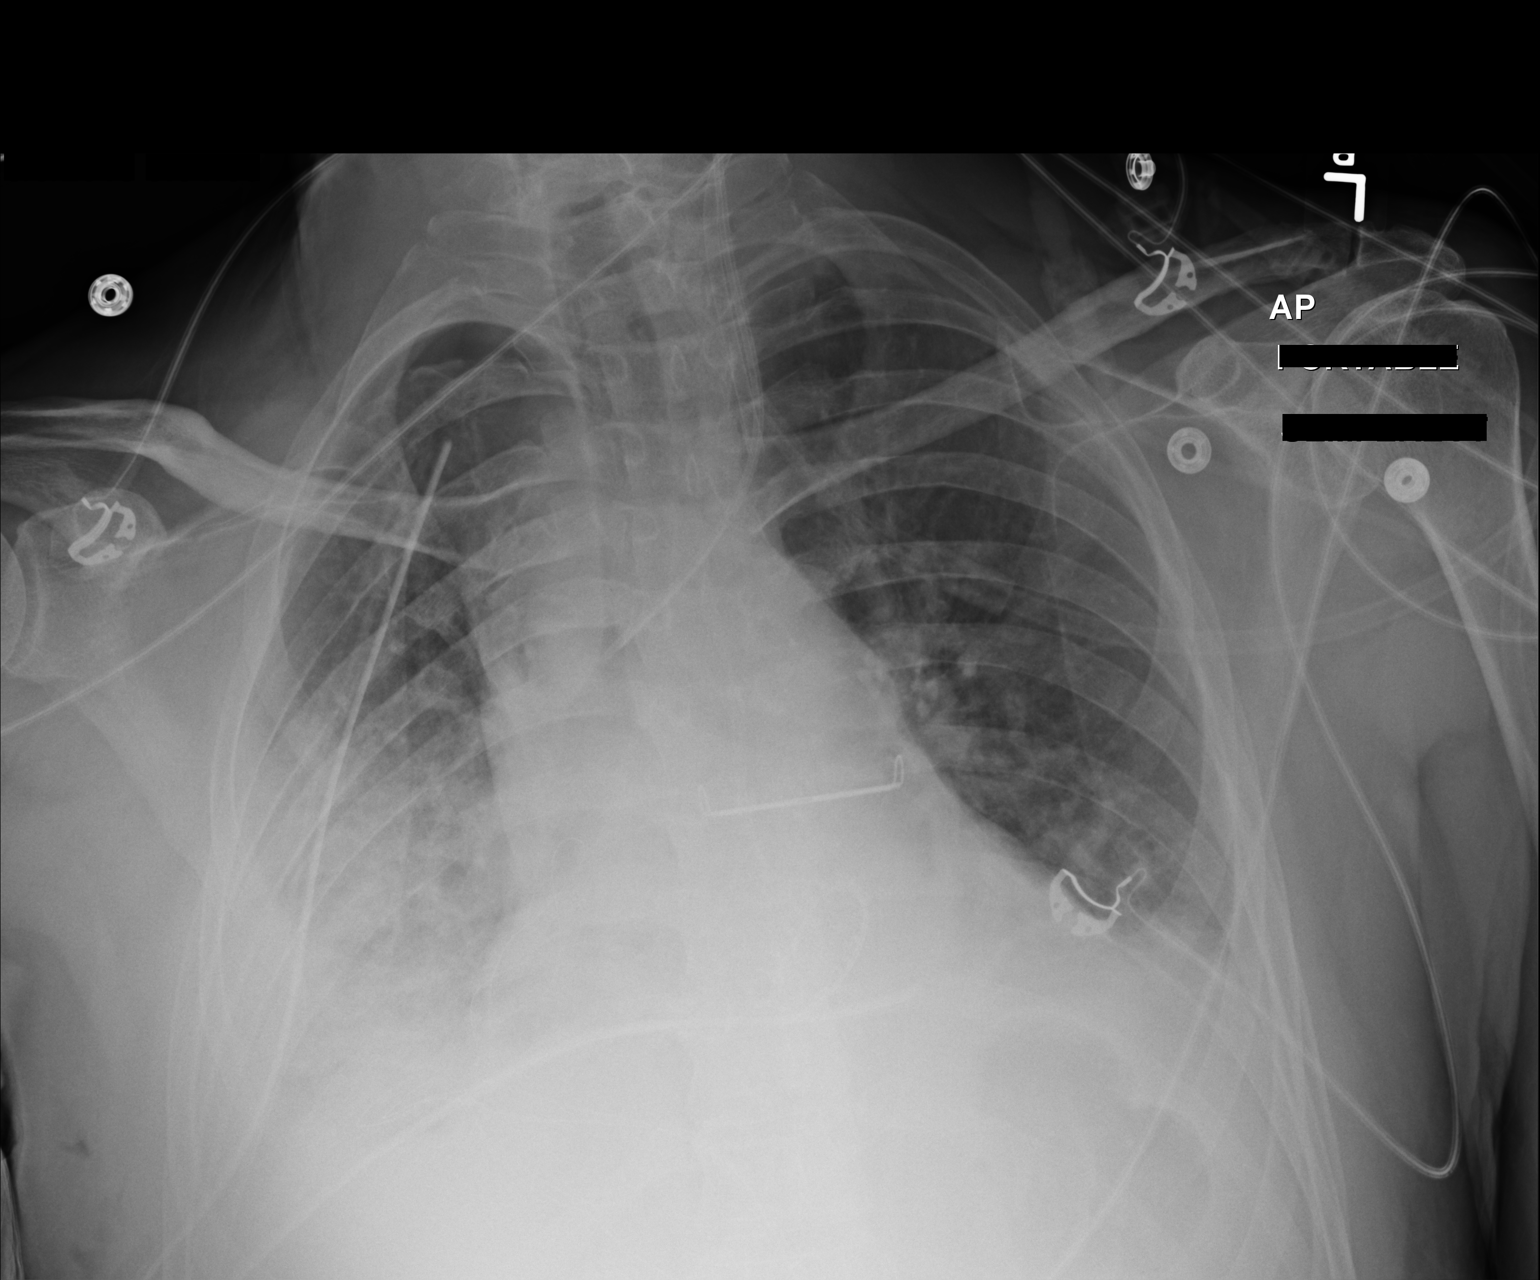

[1 of 1 positions shown; findings below may reference images not displayed]

FINDINGS: There are 2 right-sided chest tubes. Left IJ central line tip
overlies the superior vena cava. A left IJ sheath remains in place
following removal of Swan-Ganz catheter. Left atrial appendage clip
and valve replacement.

Shallow lung inflation. The heart is enlarged and there is pulmonary
vascular congestion. Persistent opacity in the right lower lobe
obscures the hemidiaphragm. There is increased opacity in the left
lower lobe in there are bilateral pleural effusions.

Small right apical pneumothorax is approximately 5% of lung volume.
IMPRESSION: 1. Right apical pneumothorax, estimated to be approximately 5% of
lung volume.
2. Cardiomegaly and pulmonary vascular congestion.
3. Bilateral lower lobe infiltrates, increased on the left.
These results will be called to the ordering clinician or
representative by the Radiologist Assistant, and communication
documented in the PACS or zVision Dashboard.

## 2017-11-04 IMAGING — CR DG CHEST 1V PORT
1 series · 1 of 1 positions shown · non-contrast
Comparison: 05/18/2016

CLINICAL DATA: Chest tube

EXAM:
PORTABLE CHEST 1 VIEW

[AP]
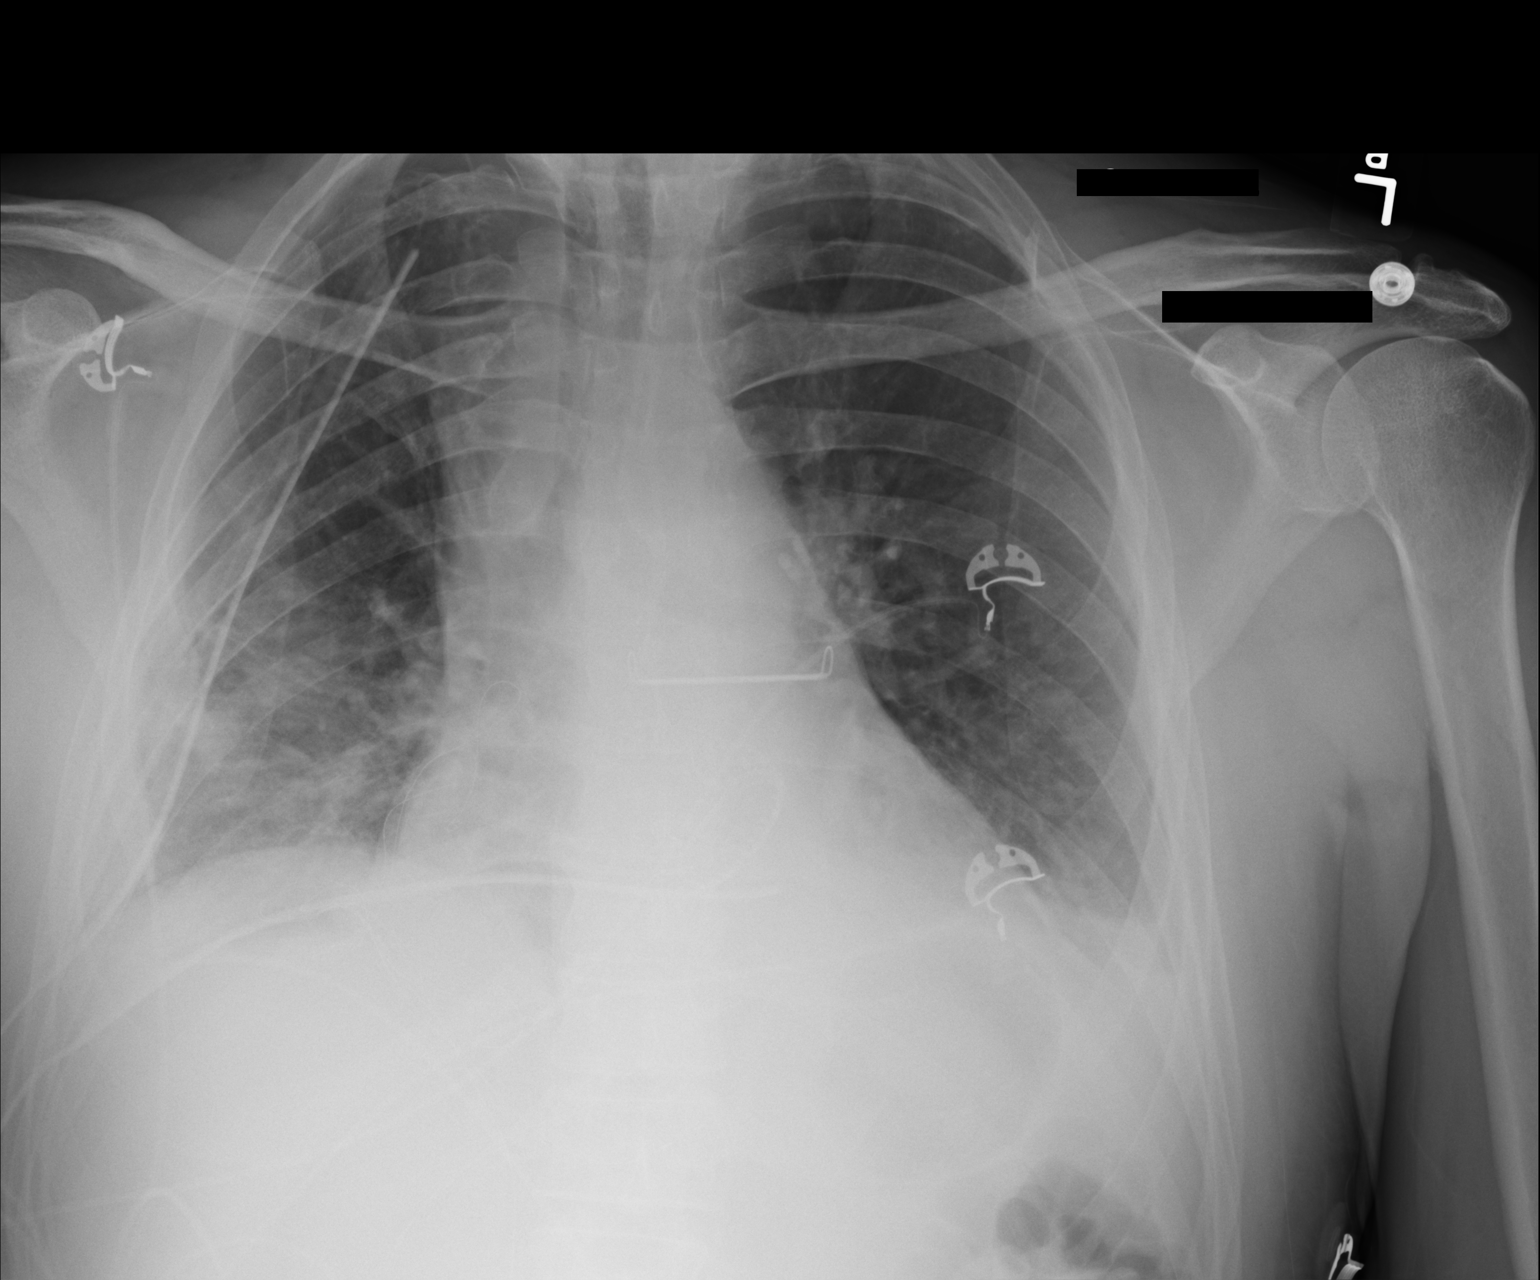

[1 of 1 positions shown; findings below may reference images not displayed]

FINDINGS: Stable right chest tube. Stable less than 5% right apical
pneumothorax. Improved bibasilar pulmonary opacities. Normal heart
size. Percutaneous pacemaker wires remain in place. Left atrial
appendage clip is stable. Valvular repair apparatus is stable.
IMPRESSION: Improved bilateral airspace disease.

Stable less than 5% right apical pneumothorax with a right chest
tube in place.
